# Patient Record
Sex: Male | Born: 1955
Health system: Southern US, Community
[De-identification: ages and names within clinical notes are randomized; demographics above are authoritative.]

## PROBLEM LIST (undated history)

## (undated) DIAGNOSIS — E785 Hyperlipidemia, unspecified: Secondary | ICD-10-CM

## (undated) DIAGNOSIS — T7840XA Allergy, unspecified, initial encounter: Secondary | ICD-10-CM

## (undated) HISTORY — DX: Hyperlipidemia, unspecified: E78.5

## (undated) HISTORY — DX: Allergy, unspecified, initial encounter: T78.40XA

---

## 2007-06-21 ENCOUNTER — Ambulatory Visit (HOSPITAL_COMMUNITY): Admission: RE | Admit: 2007-06-21 | Discharge: 2007-06-21 | Payer: Self-pay | Admitting: Gastroenterology

## 2010-02-14 ENCOUNTER — Emergency Department (HOSPITAL_COMMUNITY): Admission: EM | Admit: 2010-02-14 | Discharge: 2010-02-14 | Payer: Self-pay | Admitting: Emergency Medicine

## 2011-03-18 LAB — POCT CARDIAC MARKERS
CKMB, poc: 1.2 ng/mL (ref 1.0–8.0)
Myoglobin, poc: 106 ng/mL (ref 12–200)
Troponin i, poc: <0.05 ng/mL (ref 0.00–0.09)

## 2011-03-18 LAB — BASIC METABOLIC PANEL
BUN: 8 mg/dL (ref 6–23)
CO2: 25 mEq/L (ref 19–32)
Calcium: 8.7 mg/dL (ref 8.4–10.5)
GFR calc non Af Amer: >60 mL/min (ref >60)
Sodium: 139 mEq/L (ref 135–145)

## 2011-03-18 LAB — URINALYSIS, ROUTINE W REFLEX MICROSCOPIC
Glucose, UA: NEGATIVE mg/dL
Specific Gravity, Urine: 1.016 (ref 1.005–1.030)
pH: 7 (ref 5.0–8.0)

## 2011-03-18 LAB — CBC
HCT: 40.2 % (ref 39.0–52.0)
MCV: 86 fL (ref 78.0–100.0)

## 2011-03-18 LAB — DIFFERENTIAL
Basophils Absolute: 0.1 10*3/uL (ref 0.0–0.1)
Eosinophils Relative: 3 % (ref 0–5)
Monocytes Relative: 13 % — ABNORMAL HIGH (ref 3–12)
Neutro Abs: 3.2 10*3/uL (ref 1.7–7.7)

## 2011-05-11 NOTE — Op Note (Signed)
Ronald Sandoval, Ronald Sandoval            ACCOUNT NO.:  [id number]   MEDICAL RECORD NO.:  [id number]          PATIENT TYPE:  AMB   LOCATION:  ENDO                         FACILITY:  [facility]   PHYSICIAN:  Anselmo Rod, M.D.  DATE OF BIRTH:  February 05, 1956   DATE OF PROCEDURE:  06/21/2007  DATE OF DISCHARGE:                               OPERATIVE REPORT   PROCEDURE PERFORMED:  Screening colonoscopy.   ENDOSCOPIST:  Anselmo Rod, M.D.   INSTRUMENT USED:  Pentax video colonoscope.   INDICATIONS FOR PROCEDURE:  55 year old African American male undergoing  screening colonoscopy to rule out colonic polyps, masses, etc.   PREPROCEDURE PREPARATION:  Informed consent was procured from the  patient.  The patient fasted for 8 hours prior to the procedure and  prepped with a bottle of magnesium citrate and a gallon of NuLytely the  night prior to the procedure.  The risks and benefits of the procedure  including a 10% miss rate of cancer and polyps were discussed with the  patient, as well.   PREPROCEDURE PHYSICAL:  The patient had stable vital signs.  Neck  supple.  Lungs clear to auscultation.  S1 and S2 regular.  Abdomen soft  with normal bowel sounds.   DESCRIPTION OF PROCEDURE:  The patient was placed in the left lateral  decubitus position and sedated with by 50 mcg of Fentanyl and 4 mg of  Versed in slow incremental doses.  Once the patient was adequately  sedated, maintained on low flow oxygen and continuous cardiac  monitoring, the Pentax video colonoscope was advanced from the rectum to  the cecum.  The appendiceal orifice and cecum were clearly visualized  and photographed.  The terminal ileum appeared healthy without lesions.  No masses, polyps, erosions, ulcerations or diverticula seen.  Retroflexion in the rectum revealed no abnormalities.  The patient  tolerated the procedure well without complications.   IMPRESSION:  1. Normal colonoscopy to the terminal ileum.  No masses,  polyps or      diverticula noted.  2. No abnormalities noted on retroflexion.   RECOMMENDATIONS:  1. Repeat colonoscopy recommended in the next ten years. If the      patient has any abnormal symptoms in the interim, she should      contact the office immediately for further recommendations.  2. Outpatient follow-up as need arises in the future.      Anselmo Rod, M.D.  Electronically Signed     JNM/MEDQ  D:  06/21/2007  T:  06/21/2007  Job:  [id number]

## 2011-12-28 HISTORY — PX: OTHER SURGICAL HISTORY: SHX169

## 2015-10-15 ENCOUNTER — Encounter: Payer: Self-pay | Admitting: Medical

## 2015-10-15 ENCOUNTER — Ambulatory Visit (INDEPENDENT_AMBULATORY_CARE_PROVIDER_SITE_OTHER): Payer: BLUE CROSS/BLUE SHIELD | Admitting: Medical

## 2015-10-15 VITALS — BP 136/80 | HR 77 | Temp 98.1°F | Resp 16 | Ht 68.0 in | Wt 227.6 lb

## 2015-10-15 DIAGNOSIS — Z1211 Encounter for screening for malignant neoplasm of colon: Secondary | ICD-10-CM

## 2015-10-15 DIAGNOSIS — L989 Disorder of the skin and subcutaneous tissue, unspecified: Secondary | ICD-10-CM

## 2015-10-15 DIAGNOSIS — Z0000 Encounter for general adult medical examination without abnormal findings: Secondary | ICD-10-CM | POA: Insufficient documentation

## 2015-10-15 DIAGNOSIS — J309 Allergic rhinitis, unspecified: Secondary | ICD-10-CM | POA: Insufficient documentation

## 2015-10-15 DIAGNOSIS — E785 Hyperlipidemia, unspecified: Secondary | ICD-10-CM

## 2015-10-15 DIAGNOSIS — Z125 Encounter for screening for malignant neoplasm of prostate: Secondary | ICD-10-CM | POA: Diagnosis not present

## 2015-10-15 DIAGNOSIS — J3089 Other allergic rhinitis: Secondary | ICD-10-CM

## 2015-10-15 LAB — POCT URINALYSIS DIPSTICK
Bilirubin, UA: NEGATIVE
Glucose, UA: NEGATIVE
KETONES UA: NEGATIVE
LEUKOCYTES UA: NEGATIVE
NITRITE UA: NEGATIVE
PH UA: 7.5
PROTEIN UA: NEGATIVE
SPEC GRAV UA: 1.02
UROBILINOGEN UA: 0.2

## 2015-10-15 NOTE — Addendum Note (Signed)
Addended by: Neldon LabellaMABE, Aleksa Collinsworth S on: 10/15/2015 04:08 PM   Modules accepted: Orders

## 2015-10-15 NOTE — Assessment & Plan Note (Signed)
Mild in past. Prior provider Dc'd med since was not real high.

## 2015-10-15 NOTE — Progress Notes (Signed)
Subjective:    Patient ID: Ronald Sandoval, male    DOB: 1956/08/05, 59 y.o.   MRN: [id number]  HPI  I have reviewed pt PMH, PSH, FH, Social History and Surgical History  Pt works Primary school teacher for [employer], Exercise about 3 times a week some jogging and weights, sodas mountain dew, Moderate healthy diet, he could eat more fruits, single. 1 child.  Pt was going to [place] [facility].  Spring allergies mild- resolved with otc meds.  Hyperlipidemia- states about 1.5 years ago was taken off of his medication. He states his lipids were never real high per his report. Pt did not have any side effects from medications he use in the past. Used simvastatin.   Pt describes he may have hepatis A in past. Since then had liver enzymes elevated. Pt [facility] has followed up on this he states work up was negative.  Pt last colonosocpy was in 2006.   Pt just had flu vaccine 2 days ago at a work.  Pt never had shingles vaccine per our records. Pt not up to date with tdap. But he states usually gets check ups each year.  Pt reports no acute complaints today.     Review of Systems  Constitutional: Negative for fever, chills, diaphoresis, activity change and fatigue.  Respiratory: Negative for cough, chest tightness and shortness of breath.   Cardiovascular: Negative for chest pain, palpitations and leg swelling.  Gastrointestinal: Negative for nausea, vomiting and abdominal pain.  Musculoskeletal: Negative for neck pain and neck stiffness.  Neurological: Negative for dizziness, tremors, seizures, syncope, facial asymmetry, speech difficulty, weakness, light-headedness, numbness and headaches.  Psychiatric/Behavioral: Negative for behavioral problems, confusion and agitation. The patient is not nervous/anxious.      Past Medical History  Diagnosis Date  . Allergy   . Hyperlipidemia     Social History   Social History  . Marital Status: Widowed    Spouse Name: N/A  . Number of Children:  N/A  . Years of Education: N/A   Occupational History  . Not on file.   Social History Main Topics  . Smoking status: Never Smoker   . Smokeless tobacco: Never Used  . Alcohol Use: No  . Drug Use: No  . Sexual Activity: Not on file   Other Topics Concern  . Not on file   Social History Narrative  . No narrative on file    Past Surgical History  Procedure Laterality Date  . Corn removal Left 2013     History reviewed. No pertinent family history.  No Known Allergies  No current outpatient prescriptions on file prior to visit.   No current facility-administered medications on file prior to visit.    BP 136/80 mmHg  Pulse 77  Temp(Src) 98.1 F (36.7 C) (Oral)  Resp 16  Ht  (1.727 m)  Wt 227 lb 9.6 oz (103.239 kg)  BMI 34.61 kg/m2  SpO2 98%       Objective:   Physical Exam  General Mental Status- Alert. Orientation- Oriented x3.  Build and Nutrition- Well nourished and Well Developed.  Skin General:-Normal. Color- Normal color. Moisture- Normal. Temperature-Warm. Rt upper back- 3 moles modeate size about 6 mm. Lt lower back- mole large exceeds 8 mm. Diffuse scattered mole on back. Overall dry skin.  HEENT  Ears- Normal. Auditory Canal- Bilateral-Normal. Tympanic Membrane- Bilateral-Normal. Eye Fundi-Bilateral-Normal. Pupil- bilateral- Direct reaction to light normal. Nose & Sinuses- Normal. Nostrils-Bilateral- Normal. Mouth & Throat-Normal.  Neck Neck- No Bruits or  Masses. Trachea midline.  Thyroid- Normal.  Chest and Lung Exam Percussion: Quality and Intensity-Percussion normal. Percussion of the chest reveals- No Dullness.  Palpation: Palpation of the chest reveals- Non-tender- No dullness. Auscultation: Breath Sounds- Normal.  Adventitous Sounds:-No adventitious sounds.  Cardiovascular Inspection:- No Heaves. Auscultation:-Normal sinus rhythm without murmur gallop, S1 WNL and S2 WNL.  Abdomen Inspection:-Inspection Normal.  Inspection of the abdomen reveals- No hernias Palpation/Percussion:- Palpation and Percussion of the Abdomen reveal- Non Tender and No Palpable abdominal masses. Liver: Other Characteristics- No hepatomegaly. Spleen:Other Characteristics- No Splenomegaly. Auscultation:- Auscultation of the abdomen reveals- Bowel sounds normal and No Abdominal bruits.  Male Genitourinary Declined by pt.(pt made aware of improtance of both dre and psa. He still declines)  Rectal Declined by pt..  Peripheral  Vascular Lower Extremity:Inspection- Bilateral-Inspection Normal    Neurologic Mental Status:- Normal. Cranial Nerves:-Normal Bilaterally. Motor:-Normal. Strength:5/5 normal muscle strength-All Muscles.  Coordination-Normal. Gait- Normal.  Meningeal Signs- None.  Musculoskeletal Global Assessment General-Joints show full range of motion without obvious deformity and Normal muscle mass. Strength in upper and lower extremities.  Lymphatics General lymphatics Description- No generalized lymphadenopathy.      Assessment & Plan:

## 2015-10-15 NOTE — Progress Notes (Signed)
Pre visit review using our clinic review tool, if applicable. No additional management support is needed unless otherwise documented below in the visit note. 

## 2015-10-15 NOTE — Patient Instructions (Signed)
Allergic rhinitis Mild spring allergies.otc meds resolve when occurs.  Hyperlipidemia Mild in past. Prior provider Dc'd med since was not real high.  Wellness examination Cbc, cmp, tsh, lipid, psa and ua. Pt offered shingles vaccine today.    Follow up date to be determined after lab review.  Preventive Care for Adults, Male A healthy lifestyle and preventive care can promote health and wellness. Preventive health guidelines for men include the following key practices:  A routine yearly physical is a good way to check with your health care provider about your health and preventative screening. It is a chance to share any concerns and updates on your health and to receive a thorough exam.  Visit your dentist for a routine exam and preventative care every 6 months. Brush your teeth twice a day and floss once a day. Good oral hygiene prevents tooth decay and gum disease.  The frequency of eye exams is based on your age, health, family medical history, use of contact lenses, and other factors. Follow your health care provider's recommendations for frequency of eye exams.  Eat a healthy diet. Foods such as vegetables, fruits, whole grains, low-fat dairy products, and lean protein foods contain the nutrients you need without too many calories. Decrease your intake of foods high in solid fats, added sugars, and salt. Eat the right amount of calories for you.Get information about a proper diet from your health care provider, if necessary.  Regular physical exercise is one of the most important things you can do for your health. Most adults should get at least 150 minutes of moderate-intensity exercise (any activity that increases your heart rate and causes you to sweat) each week. In addition, most adults need muscle-strengthening exercises on 2 or more days a week.  Maintain a healthy weight. The body mass index (BMI) is a screening tool to identify possible weight problems. It provides an  estimate of body fat based on height and weight. Your health care provider can find your BMI and can help you achieve or maintain a healthy weight.For adults 20 years and older:  A BMI below 18.5 is considered underweight.  A BMI of 18.5 to 24.9 is normal.  A BMI of 25 to 29.9 is considered overweight.  A BMI of 30 and above is considered obese.  Maintain normal blood lipids and cholesterol levels by exercising and minimizing your intake of saturated fat. Eat a balanced diet with plenty of fruit and vegetables. Blood tests for lipids and cholesterol should begin at age 99 and be repeated every 5 years. If your lipid or cholesterol levels are high, you are over 50, or you are at high risk for heart disease, you may need your cholesterol levels checked more frequently.Ongoing high lipid and cholesterol levels should be treated with medicines if diet and exercise are not working.  If you smoke, find out from your health care provider how to quit. If you do not use tobacco, do not start.  Lung cancer screening is recommended for adults aged 66-80 years who are at high risk for developing lung cancer because of a history of smoking. A yearly low-dose CT scan of the lungs is recommended for people who have at least a 30-pack-year history of smoking and are a current smoker or have quit within the past 15 years. A pack year of smoking is smoking an average of 1 pack of cigarettes a day for 1 year (for example: 1 pack a day for 30 years or 2 packs a  day for 15 years). Yearly screening should continue until the smoker has stopped smoking for at least 15 years. Yearly screening should be stopped for people who develop a health problem that would prevent them from having lung cancer treatment.  If you choose to drink alcohol, do not have more than 2 drinks per day. One drink is considered to be 12 ounces (355 mL) of beer, 5 ounces (148 mL) of wine, or 1.5 ounces (44 mL) of liquor.  Avoid use of street  drugs. Do not share needles with anyone. Ask for help if you need support or instructions about stopping the use of drugs.  High blood pressure causes heart disease and increases the risk of stroke. Your blood pressure should be checked at least every 1-2 years. Ongoing high blood pressure should be treated with medicines, if weight loss and exercise are not effective.  If you are 41-33 years old, ask your health care provider if you should take aspirin to prevent heart disease.  Diabetes screening is done by taking a blood sample to check your blood glucose level after you have not eaten for a certain period of time (fasting). If you are not overweight and you do not have risk factors for diabetes, you should be screened once every 3 years starting at age 57. If you are overweight or obese and you are 57-41 years of age, you should be screened for diabetes every year as part of your cardiovascular risk assessment.  Colorectal cancer can be detected and often prevented. Most routine colorectal cancer screening begins at the age of 46 and continues through age 49. However, your health care provider may recommend screening at an earlier age if you have risk factors for colon cancer. On a yearly basis, your health care provider may provide home test kits to check for hidden blood in the stool. Use of a small camera at the end of a tube to directly examine the colon (sigmoidoscopy or colonoscopy) can detect the earliest forms of colorectal cancer. Talk to your health care provider about this at age 32, when routine screening begins. Direct exam of the colon should be repeated every 5-10 years through age 42, unless early forms of precancerous polyps or small growths are found.  People who are at an increased risk for hepatitis B should be screened for this virus. You are considered at high risk for hepatitis B if:  You were born in a country where hepatitis B occurs often. Talk with your health care provider  about which countries are considered high risk.  Your parents were born in a high-risk country and you have not received a shot to protect against hepatitis B (hepatitis B vaccine).  You have HIV or AIDS.  You use needles to inject street drugs.  You live with, or have sex with, someone who has hepatitis B.  You are a man who has sex with other men (MSM).  You get hemodialysis treatment.  You take certain medicines for conditions such as cancer, organ transplantation, and autoimmune conditions.  Hepatitis C blood testing is recommended for all people born from [birth year] through 1965 and any individual with known risks for hepatitis C.  Practice safe sex. Use condoms and avoid high-risk sexual practices to reduce the spread of sexually transmitted infections (STIs). STIs include gonorrhea, chlamydia, syphilis, trichomonas, herpes, HPV, and human immunodeficiency virus (HIV). Herpes, HIV, and HPV are viral illnesses that have no cure. They can result in disability, cancer, and death.  If  you are a man who has sex with other men, you should be screened at least once per year for:  HIV.  Urethral, rectal, and pharyngeal infection of gonorrhea, chlamydia, or both.  If you are at risk of being infected with HIV, it is recommended that you take a prescription medicine daily to prevent HIV infection. This is called preexposure prophylaxis (PrEP). You are considered at risk if:  You are a man who has sex with other men (MSM) and have other risk factors.  You are a heterosexual man, are sexually active, and are at increased risk for HIV infection.  You take drugs by injection.  You are sexually active with a partner who has HIV.  Talk with your health care provider about whether you are at high risk of being infected with HIV. If you choose to begin PrEP, you should first be tested for HIV. You should then be tested every 3 months for as long as you are taking PrEP.  A one-time screening for  abdominal aortic aneurysm (AAA) and surgical repair of large AAAs by ultrasound are recommended for men ages 18 to 33 years who are current or former smokers.  Healthy men should no longer receive prostate-specific antigen (PSA) blood tests as part of routine cancer screening. Talk with your health care provider about prostate cancer screening.  Testicular cancer screening is not recommended for adult males who have no symptoms. Screening includes self-exam, a health care provider exam, and other screening tests. Consult with your health care provider about any symptoms you have or any concerns you have about testicular cancer.  Use sunscreen. Apply sunscreen liberally and repeatedly throughout the day. You should seek shade when your shadow is shorter than you. Protect yourself by wearing long sleeves, pants, a wide-brimmed hat, and sunglasses year round, whenever you are outdoors.  Once a month, do a whole-body skin exam, using a mirror to look at the skin on your back. Tell your health care provider about new moles, moles that have irregular borders, moles that are larger than a pencil eraser, or moles that have changed in shape or color.  Stay current with required vaccines (immunizations).  Influenza vaccine. All adults should be immunized every year.  Tetanus, diphtheria, and acellular pertussis (Td, Tdap) vaccine. An adult who has not previously received Tdap or who does not know his vaccine status should receive 1 dose of Tdap. This initial dose should be followed by tetanus and diphtheria toxoids (Td) booster doses every 10 years. Adults with an unknown or incomplete history of completing a 3-dose immunization series with Td-containing vaccines should begin or complete a primary immunization series including a Tdap dose. Adults should receive a Td booster every 10 years.  Varicella vaccine. An adult without evidence of immunity to varicella should receive 2 doses or a second dose if he has  previously received 1 dose.  Human papillomavirus (HPV) vaccine. Males aged 11-21 years who have not received the vaccine previously should receive the 3-dose series. Males aged 22-26 years may be immunized. Immunization is recommended through the age of 70 years for any male who has sex with males and did not get any or all doses earlier. Immunization is recommended for any person with an immunocompromised condition through the age of 80 years if he did not get any or all doses earlier. During the 3-dose series, the second dose should be obtained 4-8 weeks after the first dose. The third dose should be obtained 24 weeks after the  first dose and 16 weeks after the second dose.  Zoster vaccine. One dose is recommended for adults aged 70 years or older unless certain conditions are present.  Measles, mumps, and rubella (MMR) vaccine. Adults born before [birth year] generally are considered immune to measles and mumps. Adults born in [birth year] or later should have 1 or more doses of MMR vaccine unless there is a contraindication to the vaccine or there is laboratory evidence of immunity to each of the three diseases. A routine second dose of MMR vaccine should be obtained at least 28 days after the first dose for students attending postsecondary schools, health care workers, or international travelers. People who received inactivated measles vaccine or an unknown type of measles vaccine during 1963-1967 should receive 2 doses of MMR vaccine. People who received inactivated mumps vaccine or an unknown type of mumps vaccine before 1979 and are at high risk for mumps infection should consider immunization with 2 doses of MMR vaccine. Unvaccinated health care workers born before [birth year] who lack laboratory evidence of measles, mumps, or rubella immunity or laboratory confirmation of disease should consider measles and mumps immunization with 2 doses of MMR vaccine or rubella immunization with 1 dose of MMR vaccine.  Pneumococcal  13-valent conjugate (PCV13) vaccine. When indicated, a person who is uncertain of his immunization history and has no record of immunization should receive the PCV13 vaccine. All adults 57 years of age and older should receive this vaccine. An adult aged 85 years or older who has certain medical conditions and has not been previously immunized should receive 1 dose of PCV13 vaccine. This PCV13 should be followed with a dose of pneumococcal polysaccharide (PPSV23) vaccine. Adults who are at high risk for pneumococcal disease should obtain the PPSV23 vaccine at least 8 weeks after the dose of PCV13 vaccine. Adults older than 60 years of age who have normal immune system function should obtain the PPSV23 vaccine dose at least 1 year after the dose of PCV13 vaccine.  Pneumococcal polysaccharide (PPSV23) vaccine. When PCV13 is also indicated, PCV13 should be obtained first. All adults aged 44 years and older should be immunized. An adult younger than age 79 years who has certain medical conditions should be immunized. Any person who resides in a nursing home or long-term care facility should be immunized. An adult smoker should be immunized. People with an immunocompromised condition and certain other conditions should receive both PCV13 and PPSV23 vaccines. People with human immunodeficiency virus (HIV) infection should be immunized as soon as possible after diagnosis. Immunization during chemotherapy or radiation therapy should be avoided. Routine use of PPSV23 vaccine is not recommended for American Indians, [place]atives, or people younger than 65 years unless there are medical conditions that require PPSV23 vaccine. When indicated, people who have unknown immunization and have no record of immunization should receive PPSV23 vaccine. One-time revaccination 5 years after the first dose of PPSV23 is recommended for people aged 19-64 years who have chronic kidney failure, nephrotic syndrome, asplenia, or  immunocompromised conditions. People who received 1-2 doses of PPSV23 before age 39 years should receive another dose of PPSV23 vaccine at age 58 years or later if at least 5 years have passed since the previous dose. Doses of PPSV23 are not needed for people immunized with PPSV23 at or after age 51 years.  Meningococcal vaccine. Adults with asplenia or persistent complement component deficiencies should receive 2 doses of quadrivalent meningococcal conjugate (MenACWY-D) vaccine. The doses should be obtained at least 2 months apart.  Microbiologists working with certain meningococcal bacteria, [place] recruits, people at risk during an outbreak, and people who travel to or live in countries with a high rate of meningitis should be immunized. A first-year college student up through age 53 years who is living in a residence hall should receive a dose if he did not receive a dose on or after his 16th birthday. Adults who have certain high-risk conditions should receive one or more doses of vaccine.  Hepatitis A vaccine. Adults who wish to be protected from this disease, have chronic liver disease, work with hepatitis A-infected animals, work in hepatitis A research labs, or travel to or work in countries with a high rate of hepatitis A should be immunized. Adults who were previously unvaccinated and who anticipate close contact with an international adoptee during the first 60 days after arrival in the [place] from a country with a high rate of hepatitis A should be immunized.  Hepatitis B vaccine. Adults should be immunized if they wish to be protected from this disease, are under age 68 years and have diabetes, have chronic liver disease, have had more than one sex partner in the past 6 months, may be exposed to blood or other infectious body fluids, are household contacts or sex partners of hepatitis B positive people, are clients or workers in certain care facilities, or travel to or work in countries  with a high rate of hepatitis B.  Haemophilus influenzae type b (Hib) vaccine. A previously unvaccinated person with asplenia or sickle cell disease or having a scheduled splenectomy should receive 1 dose of Hib vaccine. Regardless of previous immunization, a recipient of a hematopoietic stem cell transplant should receive a 3-dose series 6-12 months after his successful transplant. Hib vaccine is not recommended for adults with HIV infection. Preventive Service / Frequency Ages 80 to 70  Blood pressure check.** / Every 3-5 years.  Lipid and cholesterol check.** / Every 5 years beginning at age 80.  Hepatitis C blood test.** / For any individual with known risks for hepatitis C.  Skin self-exam. / Monthly.  Influenza vaccine. / Every year.  Tetanus, diphtheria, and acellular pertussis (Tdap, Td) vaccine.** / Consult your health care provider. 1 dose of Td every 10 years.  Varicella vaccine.** / Consult your health care provider.  HPV vaccine. / 3 doses over 6 months, if 23 or younger.  Measles, mumps, rubella (MMR) vaccine.** / You need at least 1 dose of MMR if you were born in 1957 or later. You may also need a second dose.  Pneumococcal 13-valent conjugate (PCV13) vaccine.** / Consult your health care provider.  Pneumococcal polysaccharide (PPSV23) vaccine.** / 1 to 2 doses if you smoke cigarettes or if you have certain conditions.  Meningococcal vaccine.** / 1 dose if you are age 11 to 54 years and a Market researcher living in a residence hall, or have one of several medical conditions. You may also need additional booster doses.  Hepatitis A vaccine.** / Consult your health care provider.  Hepatitis B vaccine.** / Consult your health care provider.  Haemophilus influenzae type b (Hib) vaccine.** / Consult your health care provider. Ages 70 to 66  Blood pressure check.** / Every year.  Lipid and cholesterol check.** / Every 5 years beginning at age 87.  Lung  cancer screening. / Every year if you are aged 49-80 years and have a 30-pack-year history of smoking and currently smoke or have quit within the past 15 years. Yearly screening is  stopped once you have quit smoking for at least 15 years or develop a health problem that would prevent you from having lung cancer treatment.  Fecal occult blood test (FOBT) of stool. / Every year beginning at age 47 and continuing until age 80. You may not have to do this test if you get a colonoscopy every 10 years.  Flexible sigmoidoscopy** or colonoscopy.** / Every 5 years for a flexible sigmoidoscopy or every 10 years for a colonoscopy beginning at age 37 and continuing until age 25.  Hepatitis C blood test.** / For all people born from [birth year] through 1965 and any individual with known risks for hepatitis C.  Skin self-exam. / Monthly.  Influenza vaccine. / Every year.  Tetanus, diphtheria, and acellular pertussis (Tdap/Td) vaccine.** / Consult your health care provider. 1 dose of Td every 10 years.  Varicella vaccine.** / Consult your health care provider.  Zoster vaccine.** / 1 dose for adults aged 25 years or older.  Measles, mumps, rubella (MMR) vaccine.** / You need at least 1 dose of MMR if you were born in 1957 or later. You may also need a second dose.  Pneumococcal 13-valent conjugate (PCV13) vaccine.** / Consult your health care provider.  Pneumococcal polysaccharide (PPSV23) vaccine.** / 1 to 2 doses if you smoke cigarettes or if you have certain conditions.  Meningococcal vaccine.** / Consult your health care provider.  Hepatitis A vaccine.** / Consult your health care provider.  Hepatitis B vaccine.** / Consult your health care provider.  Haemophilus influenzae type b (Hib) vaccine.** / Consult your health care provider. Ages 84 and over  Blood pressure check.** / Every year.  Lipid and cholesterol check.**/ Every 5 years beginning at age 51.  Lung cancer screening. / Every year if you  are aged 64-80 years and have a 30-pack-year history of smoking and currently smoke or have quit within the past 15 years. Yearly screening is stopped once you have quit smoking for at least 15 years or develop a health problem that would prevent you from having lung cancer treatment.  Fecal occult blood test (FOBT) of stool. / Every year beginning at age 83 and continuing until age 9. You may not have to do this test if you get a colonoscopy every 10 years.  Flexible sigmoidoscopy** or colonoscopy.** / Every 5 years for a flexible sigmoidoscopy or every 10 years for a colonoscopy beginning at age 36 and continuing until age 19.  Hepatitis C blood test.** / For all people born from [birth year] through 1966 and any individual with known risks for hepatitis C.  Abdominal aortic aneurysm (AAA) screening.** / A one-time screening for ages 4 to 37 years who are current or former smokers.  Skin self-exam. / Monthly.  Influenza vaccine. / Every year.  Tetanus, diphtheria, and acellular pertussis (Tdap/Td) vaccine.** / 1 dose of Td every 10 years.  Varicella vaccine.** / Consult your health care provider.  Zoster vaccine.** / 1 dose for adults aged 68 years or older.  Pneumococcal 13-valent conjugate (PCV13) vaccine.** / 1 dose for all adults aged 61 years and older.  Pneumococcal polysaccharide (PPSV23) vaccine.** / 1 dose for all adults aged 49 years and older.  Meningococcal vaccine.** / Consult your health care provider.  Hepatitis A vaccine.** / Consult your health care provider.  Hepatitis B vaccine.** / Consult your health care provider.  Haemophilus influenzae type b (Hib) vaccine.** / Consult your health care provider. **Family history and personal history of risk and conditions may change your  health care provider's recommendations.   This information is not intended to replace advice given to you by your health care provider. Make sure you discuss any questions you have with your  health care provider.   Document Released: 02/08/2002 Document Revised: 01/03/2015 Document Reviewed: 05/10/2011 Elsevier Interactive Patient Education [employer].

## 2015-10-15 NOTE — Assessment & Plan Note (Signed)
Mild spring allergies.otc meds resolve when occurs.

## 2015-10-15 NOTE — Assessment & Plan Note (Signed)
Cbc, cmp, tsh, lipid, psa and ua. Pt offered shingles vaccine today.

## 2015-10-16 ENCOUNTER — Encounter: Payer: Self-pay | Admitting: Medical

## 2015-10-16 LAB — COMPREHENSIVE METABOLIC PANEL
ALBUMIN: 3.9 g/dL (ref 3.5–5.2)
ALK PHOS: 66 U/L (ref 39–117)
ALT: 14 U/L (ref 0–53)
AST: 22 U/L (ref 0–37)
BILIRUBIN TOTAL: 0.9 mg/dL (ref 0.2–1.2)
BUN: 10 mg/dL (ref 6–23)
CO2: 29 mEq/L (ref 19–32)
Calcium: 9 mg/dL (ref 8.4–10.5)
Chloride: 101 mEq/L (ref 96–112)
Creatinine, Ser: 1.27 mg/dL (ref 0.40–1.50)
GFR: 74.38 mL/min (ref >60.00)
Glucose, Bld: 85 mg/dL (ref 70–99)
POTASSIUM: 4.1 meq/L (ref 3.5–5.1)
Sodium: 138 mEq/L (ref 135–145)
TOTAL PROTEIN: 7.3 g/dL (ref 6.0–8.3)

## 2015-10-16 LAB — CBC WITH DIFFERENTIAL/PLATELET
Basophils Absolute: 0.1 10*3/uL (ref 0.0–0.1)
Basophils Relative: 1.1 % (ref 0.0–3.0)
EOS PCT: 2.9 % (ref 0.0–5.0)
Eosinophils Absolute: 0.2 10*3/uL (ref 0.0–0.7)
HEMATOCRIT: 43.7 % (ref 39.0–52.0)
HEMOGLOBIN: 14.2 g/dL (ref 13.0–17.0)
LYMPHS PCT: 34.2 % (ref 12.0–46.0)
Lymphs Abs: 2.1 10*3/uL (ref 0.7–4.0)
MCHC: 32.5 g/dL (ref 30.0–36.0)
MCV: 86.6 fl (ref 78.0–100.0)
MONOS PCT: 8.6 % (ref 3.0–12.0)
Monocytes Absolute: 0.5 10*3/uL (ref 0.1–1.0)
Neutro Abs: 3.2 10*3/uL (ref 1.4–7.7)
Neutrophils Relative %: 53.2 % (ref 43.0–77.0)
Platelets: 249 10*3/uL (ref 150.0–400.0)
RBC: 5.05 Mil/uL (ref 4.22–5.81)
RDW: 15.1 % (ref 11.5–15.5)
WBC: 6 10*3/uL (ref 4.0–10.5)

## 2015-10-16 LAB — LIPID PANEL
Cholesterol: 204 mg/dL — ABNORMAL HIGH (ref 0–200)
HDL: 57.7 mg/dL (ref >39.00)
LDL Cholesterol: 133 mg/dL — ABNORMAL HIGH (ref 0–99)
NONHDL: 146.27
TRIGLYCERIDES: 67 mg/dL (ref 0.0–149.0)
Total CHOL/HDL Ratio: 4
VLDL: 13.4 mg/dL (ref 0.0–40.0)

## 2015-10-16 LAB — PSA: PSA: 0.21 ng/mL (ref 0.10–4.00)

## 2015-10-16 LAB — TSH: TSH: 1.12 u[IU]/mL (ref 0.35–4.50)

## 2016-01-30 ENCOUNTER — Telehealth: Payer: Self-pay | Admitting: Medical

## 2016-01-30 NOTE — Telephone Encounter (Signed)
Documented 01/30/16.

## 2016-01-30 NOTE — Telephone Encounter (Signed)
Per OV note 10/15/15 pt had flu shot 2 days before appt

## 2016-08-24 DIAGNOSIS — D2371 Other benign neoplasm of skin of right lower limb, including hip: Secondary | ICD-10-CM | POA: Diagnosis not present

## 2016-08-24 DIAGNOSIS — M25571 Pain in right ankle and joints of right foot: Secondary | ICD-10-CM | POA: Diagnosis not present

## 2016-10-21 DIAGNOSIS — Z23 Encounter for immunization: Secondary | ICD-10-CM | POA: Diagnosis not present

## 2016-11-11 ENCOUNTER — Telehealth: Payer: Self-pay | Admitting: Medical

## 2016-11-11 ENCOUNTER — Ambulatory Visit (INDEPENDENT_AMBULATORY_CARE_PROVIDER_SITE_OTHER): Payer: BLUE CROSS/BLUE SHIELD | Admitting: Medical

## 2016-11-11 VITALS — BP 163/87 | HR 89 | Temp 98.3°F | Wt 235.8 lb

## 2016-11-11 DIAGNOSIS — Z0000 Encounter for general adult medical examination without abnormal findings: Secondary | ICD-10-CM | POA: Diagnosis not present

## 2016-11-11 DIAGNOSIS — Z113 Encounter for screening for infections with a predominantly sexual mode of transmission: Secondary | ICD-10-CM

## 2016-11-11 DIAGNOSIS — Z1159 Encounter for screening for other viral diseases: Secondary | ICD-10-CM

## 2016-11-11 DIAGNOSIS — Z1211 Encounter for screening for malignant neoplasm of colon: Secondary | ICD-10-CM

## 2016-11-11 MED ORDER — HYDROCHLOROTHIAZIDE 12.5 MG PO TABS: 12.5000 mg | ORAL_TABLET | Freq: Every day | ORAL | 0 refills | Status: DC

## 2016-11-11 NOTE — Telephone Encounter (Signed)
Pt saw dermatologist in 2016. Will you find out who he saw and ask them to send office note. Pt had many small to large moles on his back. Wanted to see when dermatologist recommended pt to follow up. I want the note but would you ask them to review and tell you on phone what follow up recommendation was?

## 2016-11-11 NOTE — Progress Notes (Signed)
Subjective:    Patient ID: Ronald Sandoval, male    DOB: 25-Apr-1956, 60 y.o.   MRN: [id number]  HPI   I have reviewed pt PMH, PSH, FH, Social History and Surgical History  Pt works Primary school teacherinspection for [employer], Exercise about 3 times a week some jogging and weights, sodas mountain dew, Moderate healthy diet, he could eat more fruits, single. 1 child.  Pt willing to get colonoscopy in 2018.   Pt got flu vaccine in October.  Per epic last tdap 2013.   Pt is willing to get hep c testing.   Pt willing to get hiv screen as well.  Pt states at [facility] checked bp twice last year and bp was 130/80  Review of Systems  Constitutional: Negative for chills, fatigue and fever.  HENT: Negative for congestion, ear pain, postnasal drip and rhinorrhea.   Respiratory: Negative for cough, choking, shortness of breath and wheezing.   Cardiovascular: Negative for chest pain and palpitations.  Gastrointestinal: Negative for abdominal pain, anal bleeding, blood in stool, diarrhea, nausea and vomiting.  Musculoskeletal: Negative for back pain, myalgias, neck pain and neck stiffness.  Skin: Negative for rash.  Neurological: Negative for dizziness, seizures, weakness, numbness and headaches.  Hematological: Negative for adenopathy. Does not bruise/bleed easily.  Psychiatric/Behavioral: Negative for agitation, confusion, decreased concentration, hallucinations and suicidal ideas. The patient is not nervous/anxious.    Past Medical History:  Diagnosis Date  . Allergy   . Hyperlipidemia      Social History   Social History  . Marital status: Widowed    Spouse name: N/A  . Number of children: N/A  . Years of education: N/A   Occupational History  . Not on file.   Social History Main Topics  . Smoking status: Never Smoker  . Smokeless tobacco: Never Used  . Alcohol use No  . Drug use: No  . Sexual activity: Not on file   Other Topics Concern  . Not on file   Social History  Narrative  . No narrative on file    Past Surgical History:  Procedure Laterality Date  . corn removal Left 2013     No family history on file.  No Known Allergies  No current outpatient prescriptions on file prior to visit.   No current facility-administered medications on file prior to visit.     BP (!) 163/87 (BP Location: Left Arm, Patient Position: Sitting, Cuff Size: Normal)   Pulse 89   Temp 98.3 F (36.8 C) (Oral)   Wt 235 lb 12.8 oz (107 kg)   SpO2 100%   BMI 35.85 kg/m       Objective:   Physical Exam   General Mental Status- Alert. General Appearance- Not in acute distress.   Skin General: Color- Normal Color. Moisture- Normal Moisture.  Neck Carotid Arteries- Normal color. Moisture- Normal Moisture. No carotid bruits. No JVD.  Chest and Lung Exam Auscultation: Breath Sounds:-Normal.  Cardiovascular Auscultation:Rythm- Regular. Murmurs & Other Heart Sounds:Auscultation of the heart reveals- No Murmurs.  Abdomen Inspection:-Inspeection Normal. Palpation/Percussion:Note:No mass. Palpation and Percussion of the abdomen reveal- Non Tender, Non Distended + BS, no rebound or guarding.  Neurologic Cranial Nerve exam:- CN III-XII intact(No nystagmus), symmetric smile. Strength:- 5/5 equal and symmetric strength both upper and lower extremities.  Rectal- normal sphincter tone. No masses. Prostate smooth and normal size. hemocult card negatve.  Genital exam- normal exam. No testicle masses or lump. No hernia on inguinal exam inspection.  Derm- scattered moles over  back. Moderate to high in number.(pt states when sent to derm no concerns and no follow up recommendations)      Assessment & Plan:  For your wellness exam I put in future labs. Also will get urine dip on day of physical.  For your high bp today. Please get bp machine otc and check daily. If bp is increasing start hctz. If bp increases and cardiac or neurologic symptoms then ED  evaluation.  Bring bp readings in on Monday when you get labs.  Low salt diet and exercise.  Follow up in 2 wks or as needed

## 2016-11-11 NOTE — Patient Instructions (Addendum)
For your wellness exam I put in future labs. Also will get urine dip on day of physical.  For your high bp today. Please get bp machine otc and check daily. If bp is increasing start hctz. If bp increases and cardiac or neurologic symptoms then ED evaluation.  Bring bp readings in on Monday when you get labs.  Low salt diet and exercise.  Follow up in 2 wks or as needed  DASH Eating Plan DASH stands for "Dietary Approaches to Stop Hypertension." The DASH eating plan is a healthy eating plan that has been shown to reduce high blood pressure (hypertension). Additional health benefits may include reducing the risk of type 2 diabetes mellitus, heart disease, and stroke. The DASH eating plan may also help with weight loss. What do I need to know about the DASH eating plan? For the DASH eating plan, you will follow these general guidelines:  Choose foods with less than 150 milligrams of sodium per serving (as listed on the food label).  Use salt-free seasonings or herbs instead of table salt or sea salt.  Check with your health care provider or pharmacist before using salt substitutes.  Eat lower-sodium products. These are often labeled as "low-sodium" or "no salt added."  Eat fresh foods. Avoid eating a lot of canned foods.  Eat more vegetables, fruits, and low-fat dairy products.  Choose whole grains. Look for the word "whole" as the first word in the ingredient list.  Choose fish and skinless chicken or [place] more often than red meat. Limit fish, poultry, and meat to 6 oz (170 g) each day.  Limit sweets, desserts, sugars, and sugary drinks.  Choose heart-healthy fats.  Eat more home-cooked food and less restaurant, buffet, and fast food.  Limit fried foods.  Do not fry foods. Cook foods using methods such as baking, boiling, grilling, and broiling instead.  When eating at a restaurant, ask that your food be prepared with less salt, or no salt if possible. What foods can I  eat? Seek help from a dietitian for individual calorie needs. Grains  Whole grain or whole wheat bread. Brown rice. Whole grain or whole wheat pasta. Quinoa, bulgur, and whole grain cereals. Low-sodium cereals. Corn or whole wheat flour tortillas. Whole grain cornbread. Whole grain crackers. Low-sodium crackers. Vegetables  Fresh or frozen vegetables (raw, steamed, roasted, or grilled). Low-sodium or reduced-sodium tomato and vegetable juices. Low-sodium or reduced-sodium tomato sauce and paste. Low-sodium or reduced-sodium canned vegetables. Fruits  All fresh, canned (in natural juice), or frozen fruits. Meat and Other Protein Products  Ground beef (85% or leaner), grass-fed beef, or beef trimmed of fat. Skinless chicken or [place]. Ground chicken or [place]. Pork trimmed of fat. All fish and seafood. Eggs. Dried beans, peas, or lentils. Unsalted nuts and seeds. Unsalted canned beans. Dairy  Low-fat dairy products, such as skim or 1% milk, 2% or reduced-fat cheeses, low-fat ricotta or cottage cheese, or plain low-fat yogurt. Low-sodium or reduced-sodium cheeses. Fats and Oils  Tub margarines without trans fats. Light or reduced-fat mayonnaise and salad dressings (reduced sodium). Avocado. Safflower, olive, or canola oils. Natural peanut or almond butter. Other  Unsalted popcorn and pretzels. The items listed above may not be a complete list of recommended foods or beverages. Contact your dietitian for more options.  What foods are not recommended? Grains  White bread. White pasta. White rice. Refined cornbread. Bagels and croissants. Crackers that contain trans fat. Vegetables  Creamed or fried vegetables. Vegetables in a cheese sauce.  Regular canned vegetables. Regular canned tomato sauce and paste. Regular tomato and vegetable juices. Fruits  Canned fruit in light or heavy syrup. Fruit juice. Meat and Other Protein Products  Fatty cuts of meat. Ribs, chicken wings, bacon, sausage, bologna,  salami, chitterlings, fatback, hot dogs, bratwurst, and packaged luncheon meats. Salted nuts and seeds. Canned beans with salt. Dairy  Whole or 2% milk, cream, half-and-half, and cream cheese. Whole-fat or sweetened yogurt. Full-fat cheeses or blue cheese. Nondairy creamers and whipped toppings. Processed cheese, cheese spreads, or cheese curds. Condiments  Onion and garlic salt, seasoned salt, table salt, and sea salt. Canned and packaged gravies. Worcestershire sauce. Tartar sauce. Barbecue sauce. Teriyaki sauce. Soy sauce, including reduced sodium. Steak sauce. Fish sauce. Oyster sauce. Cocktail sauce. Horseradish. Ketchup and mustard. Meat flavorings and tenderizers. Bouillon cubes. Hot sauce. Tabasco sauce. Marinades. Taco seasonings. Relishes. Fats and Oils  Butter, stick margarine, lard, shortening, ghee, and bacon fat. Coconut, palm kernel, or palm oils. Regular salad dressings. Other  Pickles and olives. Salted popcorn and pretzels. The items listed above may not be a complete list of foods and beverages to avoid. Contact your dietitian for more information.  Where can I find more information? National Heart, Lung, and Blood Institute: travelstabloid.com This information is not intended to replace advice given to you by your health care provider. Make sure you discuss any questions you have with your health care provider. Document Released: 12/02/2011 Document Revised: 05/20/2016 Document Reviewed: 10/17/2013 Elsevier Interactive Patient Education  2017 [place] Years, Male Preventive care refers to lifestyle choices and visits with your health care provider that can promote health and wellness. What does preventive care include?  A yearly physical exam. This is also called an annual well check.  Dental exams once or twice a year.  Routine eye exams. Ask your health care provider how often you should have your eyes  checked.  Personal lifestyle choices, including:  Daily care of your teeth and gums.  Regular physical activity.  Eating a healthy diet.  Avoiding tobacco and drug use.  Limiting alcohol use.  Practicing safe sex.  Taking low-dose aspirin every day starting at age 41. What happens during an annual well check? The services and screenings done by your health care provider during your annual well check will depend on your age, overall health, lifestyle risk factors, and family history of disease. Counseling  Your health care provider may ask you questions about your:  Alcohol use.  Tobacco use.  Drug use.  Emotional well-being.  Home and relationship well-being.  Sexual activity.  Eating habits.  Work and work Statistician. Screening  You may have the following tests or measurements:  Height, weight, and BMI.  Blood pressure.  Lipid and cholesterol levels. These may be checked every 5 years, or more frequently if you are over 9 years old.  Skin check.  Lung cancer screening. You may have this screening every year starting at age 29 if you have a 30-pack-year history of smoking and currently smoke or have quit within the past 15 years.  Fecal occult blood test (FOBT) of the stool. You may have this test every year starting at age 94.  Flexible sigmoidoscopy or colonoscopy. You may have a sigmoidoscopy every 5 years or a colonoscopy every 10 years starting at age 78.  Prostate cancer screening. Recommendations will vary depending on your family history and other risks.  Hepatitis C blood test.  Hepatitis B blood test.  Sexually transmitted disease (STD) testing.  Diabetes screening. This is done by checking your blood sugar (glucose) after you have not eaten for a while (fasting). You may have this done every 1-3 years. Discuss your test results, treatment options, and if necessary, the need for more tests with your health care provider. Vaccines  Your  health care provider may recommend certain vaccines, such as:  Influenza vaccine. This is recommended every year.  Tetanus, diphtheria, and acellular pertussis (Tdap, Td) vaccine. You may need a Td booster every 10 years.  Varicella vaccine. You may need this if you have not been vaccinated.  Zoster vaccine. You may need this after age 29.  Measles, mumps, and rubella (MMR) vaccine. You may need at least one dose of MMR if you were born in 1957 or later. You may also need a second dose.  Pneumococcal 13-valent conjugate (PCV13) vaccine. You may need this if you have certain conditions and have not been vaccinated.  Pneumococcal polysaccharide (PPSV23) vaccine. You may need one or two doses if you smoke cigarettes or if you have certain conditions.  Meningococcal vaccine. You may need this if you have certain conditions.  Hepatitis A vaccine. You may need this if you have certain conditions or if you travel or work in places where you may be exposed to hepatitis A.  Hepatitis B vaccine. You may need this if you have certain conditions or if you travel or work in places where you may be exposed to hepatitis B.  Haemophilus influenzae type b (Hib) vaccine. You may need this if you have certain risk factors. Talk to your health care provider about which screenings and vaccines you need and how often you need them. This information is not intended to replace advice given to you by your health care provider. Make sure you discuss any questions you have with your health care provider. Document Released: 01/09/2016 Document Revised: 09/01/2016 Document Reviewed: 10/14/2015 Elsevier Interactive Patient Education  2017 [employer].

## 2016-11-11 NOTE — Progress Notes (Signed)
Pre visit review using our clinic review tool, if applicable. No additional management support is needed unless otherwise documented below in the visit note. 

## 2016-11-12 NOTE — Telephone Encounter (Signed)
Lm on vm to find out which Dermatologist he saw

## 2016-11-15 ENCOUNTER — Other Ambulatory Visit: Payer: BLUE CROSS/BLUE SHIELD

## 2016-11-16 ENCOUNTER — Other Ambulatory Visit: Payer: BLUE CROSS/BLUE SHIELD

## 2016-11-16 ENCOUNTER — Telehealth: Payer: Self-pay | Admitting: Medical

## 2016-11-16 ENCOUNTER — Other Ambulatory Visit (INDEPENDENT_AMBULATORY_CARE_PROVIDER_SITE_OTHER): Payer: BLUE CROSS/BLUE SHIELD

## 2016-11-16 DIAGNOSIS — Z113 Encounter for screening for infections with a predominantly sexual mode of transmission: Secondary | ICD-10-CM | POA: Diagnosis not present

## 2016-11-16 DIAGNOSIS — Z0000 Encounter for general adult medical examination without abnormal findings: Secondary | ICD-10-CM

## 2016-11-16 DIAGNOSIS — Z1159 Encounter for screening for other viral diseases: Secondary | ICD-10-CM

## 2016-11-16 DIAGNOSIS — E785 Hyperlipidemia, unspecified: Secondary | ICD-10-CM

## 2016-11-16 LAB — COMPREHENSIVE METABOLIC PANEL
ALBUMIN: 4 g/dL (ref 3.5–5.2)
ALK PHOS: 70 U/L (ref 39–117)
ALT: 15 U/L (ref 0–53)
AST: 20 U/L (ref 0–37)
BILIRUBIN TOTAL: 0.8 mg/dL (ref 0.2–1.2)
BUN: 11 mg/dL (ref 6–23)
CALCIUM: 8.9 mg/dL (ref 8.4–10.5)
CO2: 27 meq/L (ref 19–32)
CREATININE: 1.3 mg/dL (ref 0.40–1.50)
Chloride: 103 mEq/L (ref 96–112)
GFR: 72.14 mL/min (ref >60.00)
Glucose, Bld: 90 mg/dL (ref 70–99)
Potassium: 4.1 mEq/L (ref 3.5–5.1)
Sodium: 137 mEq/L (ref 135–145)
Total Protein: 7.2 g/dL (ref 6.0–8.3)

## 2016-11-16 LAB — LIPID PANEL
CHOLESTEROL: 202 mg/dL — AB (ref 0–200)
HDL: 53 mg/dL (ref >39.00)
LDL CALC: 136 mg/dL — AB (ref 0–99)
NonHDL: 148.9
TRIGLYCERIDES: 65 mg/dL (ref 0.0–149.0)
Total CHOL/HDL Ratio: 4
VLDL: 13 mg/dL (ref 0.0–40.0)

## 2016-11-16 LAB — PSA: PSA: 0.24 ng/mL (ref 0.10–4.00)

## 2016-11-16 LAB — CBC WITH DIFFERENTIAL/PLATELET
BASOS ABS: 0 10*3/uL (ref 0.0–0.1)
BASOS PCT: 0.7 % (ref 0.0–3.0)
Eosinophils Absolute: 0.1 10*3/uL (ref 0.0–0.7)
Eosinophils Relative: 2.2 % (ref 0.0–5.0)
HEMATOCRIT: 43.9 % (ref 39.0–52.0)
HEMOGLOBIN: 14.5 g/dL (ref 13.0–17.0)
LYMPHS PCT: 37.7 % (ref 12.0–46.0)
Lymphs Abs: 2.3 10*3/uL (ref 0.7–4.0)
MCHC: 33 g/dL (ref 30.0–36.0)
MCV: 85.1 fl (ref 78.0–100.0)
MONOS PCT: 9.4 % (ref 3.0–12.0)
Monocytes Absolute: 0.6 10*3/uL (ref 0.1–1.0)
NEUTROS ABS: 3.1 10*3/uL (ref 1.4–7.7)
Neutrophils Relative %: 50 % (ref 43.0–77.0)
PLATELETS: 293 10*3/uL (ref 150.0–400.0)
RBC: 5.16 Mil/uL (ref 4.22–5.81)
RDW: 14.8 % (ref 11.5–15.5)
WBC: 6.2 10*3/uL (ref 4.0–10.5)

## 2016-11-16 LAB — POC URINALSYSI DIPSTICK (AUTOMATED)
Bilirubin, UA: NEGATIVE
Glucose, UA: NEGATIVE
Ketones, UA: NEGATIVE
Leukocytes, UA: NEGATIVE
NITRITE UA: NEGATIVE
PH UA: 6
Protein, UA: NEGATIVE
RBC UA: NEGATIVE
UROBILINOGEN UA: 0.2

## 2016-11-16 LAB — TSH: TSH: 1.25 u[IU]/mL (ref 0.35–4.50)

## 2016-11-16 LAB — HIV ANTIBODY (ROUTINE TESTING W REFLEX): HIV: NONREACTIVE

## 2016-11-16 NOTE — Telephone Encounter (Signed)
I forgot to mention but have him repeat lipid panel in 3 months fasting.

## 2016-11-17 LAB — HEPATITIS C ANTIBODY: HCV AB: NEGATIVE

## 2016-11-17 NOTE — Addendum Note (Signed)
Addended by: Mervin KungFERGERSON, Adael Culbreath A on: 11/17/2016 05:02 PM   Modules accepted: Orders

## 2016-11-17 NOTE — Telephone Encounter (Signed)
Notified pt and scheduled lab appt for 02/17/17 at 7am.  Future lab order entered.

## 2016-12-25 ENCOUNTER — Other Ambulatory Visit: Payer: Self-pay | Admitting: Medical

## 2017-01-14 ENCOUNTER — Encounter: Payer: Self-pay | Admitting: Medical

## 2017-02-15 ENCOUNTER — Other Ambulatory Visit: Payer: Self-pay | Admitting: Medical

## 2017-02-17 ENCOUNTER — Other Ambulatory Visit: Payer: Self-pay | Admitting: Medical

## 2017-02-17 ENCOUNTER — Other Ambulatory Visit: Payer: BLUE CROSS/BLUE SHIELD

## 2017-02-17 MED ORDER — HYDROCHLOROTHIAZIDE 12.5 MG PO TABS: 12.5000 mg | ORAL_TABLET | Freq: Every day | ORAL | 0 refills | Status: DC

## 2017-03-10 ENCOUNTER — Other Ambulatory Visit (INDEPENDENT_AMBULATORY_CARE_PROVIDER_SITE_OTHER): Payer: BLUE CROSS/BLUE SHIELD

## 2017-03-10 DIAGNOSIS — E785 Hyperlipidemia, unspecified: Secondary | ICD-10-CM | POA: Diagnosis not present

## 2017-03-10 LAB — LIPID PANEL
CHOL/HDL RATIO: 4
Cholesterol: 186 mg/dL (ref 0–200)
HDL: 45.8 mg/dL (ref >39.00)
LDL Cholesterol: 116 mg/dL — ABNORMAL HIGH (ref 0–99)
NONHDL: 139.71
Triglycerides: 120 mg/dL (ref 0.0–149.0)
VLDL: 24 mg/dL (ref 0.0–40.0)

## 2017-03-14 ENCOUNTER — Other Ambulatory Visit: Payer: Self-pay | Admitting: Medical

## 2017-03-14 DIAGNOSIS — E785 Hyperlipidemia, unspecified: Secondary | ICD-10-CM

## 2017-03-17 ENCOUNTER — Telehealth: Payer: Self-pay | Admitting: *Deleted

## 2017-03-17 ENCOUNTER — Other Ambulatory Visit: Payer: BLUE CROSS/BLUE SHIELD

## 2017-03-17 MED ORDER — HYDROCHLOROTHIAZIDE 12.5 MG PO TABS
12.5000 mg | ORAL_TABLET | Freq: Every day | ORAL | 0 refills | Status: DC
Start: 2017-03-17 — End: 2017-03-21

## 2017-03-17 NOTE — Telephone Encounter (Signed)
Faxed refill request received from [facility] for HCTZ 12.5mg  [requesting 90-day supply] Last filled by MD on 02/17/2017, #30x0 [pt was to have OV and/or labs] Last AEX - 11/11/16 Next AEX - ROV was to be in 2 Wks. [pt canceled 11/16/16 OV, 02/17/17 OV] Please Advise on refills/SLS 03/22

## 2017-03-17 NOTE — Telephone Encounter (Signed)
Pt has not shown up for follow ups. I don't know if this medication is working. Will rx 30 day of hctz. He needs to commit to follow up. Very important. See bp on that last visit with me. He needs to come in while on his meds to know if working or if needs adjusted. Might kindly explain if he does not then might consider dismissing as follow ups are critical to preventing bad outcomes from uncrontrolled htn.

## 2017-03-18 NOTE — Telephone Encounter (Signed)
Spoke with pt and advised him of below and discussed need for compliant follow up in the office. Pt voices understanding and scheduled appt for 03/24/17 at 5:15pm. 30 day rx sent to pharmacy.

## 2017-03-21 ENCOUNTER — Telehealth: Payer: Self-pay | Admitting: Medical

## 2017-03-21 MED ORDER — HYDROCHLOROTHIAZIDE 12.5 MG PO TABS: 12.5000 mg | ORAL_TABLET | Freq: Every day | ORAL | 0 refills | Status: DC

## 2017-03-21 NOTE — Telephone Encounter (Signed)
[facility] supply of pt's hydrochlorothiazide.    [facility].

## 2017-03-21 NOTE — Telephone Encounter (Signed)
Rx request to pharmacy; Requested drug refills are authorized, however, the patient needs further evaluation and/or laboratory testing before further refills are given. Ask him to make an appointment for this/SLS 03/26  Please call patient and schedule F/U visit/SLS 03/26

## 2017-03-24 ENCOUNTER — Telehealth: Payer: Self-pay | Admitting: Medical

## 2017-03-24 ENCOUNTER — Ambulatory Visit (INDEPENDENT_AMBULATORY_CARE_PROVIDER_SITE_OTHER): Payer: BLUE CROSS/BLUE SHIELD | Admitting: Medical

## 2017-03-24 ENCOUNTER — Encounter: Payer: Self-pay | Admitting: Medical

## 2017-03-24 VITALS — BP 139/85 | HR 70 | Temp 98.2°F | Resp 18 | Wt 230.8 lb

## 2017-03-24 DIAGNOSIS — I10 Essential (primary) hypertension: Secondary | ICD-10-CM | POA: Diagnosis not present

## 2017-03-24 DIAGNOSIS — E785 Hyperlipidemia, unspecified: Secondary | ICD-10-CM

## 2017-03-24 MED ORDER — HYDROCHLOROTHIAZIDE 12.5 MG PO TABS: 12.5000 mg | ORAL_TABLET | Freq: Every day | ORAL | 0 refills | Status: DC

## 2017-03-24 MED ORDER — LISINOPRIL 10 MG PO TABS: 10.0000 mg | ORAL_TABLET | Freq: Every day | ORAL | 3 refills | Status: DC

## 2017-03-24 NOTE — Progress Notes (Signed)
   Subjective:    Patient ID: Ronald Sandoval, male    DOB: February 22, 1956, 61 y.o.   MRN: [id number]  HPI  Pt in for bp. Check. He is on the hctz. No side effects.  On review no chest pain or neurologic symptoms.  Pt states starting to exercise little bit. 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  Bp last visit was 163/87.Bp better today.  Pt states he checks his bp about every 3 days. Bp usually slightly less than 140/90.  Pt had mild high cholesterol. Pt diet moderate healty.   Review of Systems  Constitutional: Negative for chills, fatigue and fever.  Respiratory: Negative for cough, chest tightness, shortness of breath and wheezing.   Cardiovascular: Negative for chest pain and palpitations.  Gastrointestinal: Negative for abdominal pain.  Musculoskeletal: Negative for back pain, myalgias and neck stiffness.  Neurological: Negative for dizziness, syncope, weakness, numbness and headaches.  Hematological: Negative for adenopathy. Does not bruise/bleed easily.  Psychiatric/Behavioral: Negative for behavioral problems and confusion. The patient is not nervous/anxious.    Past Medical History:  Diagnosis Date  . Allergy   . Hyperlipidemia      Social History   Social History  . Marital status: Widowed    Spouse name: N/A  . Number of children: N/A  . Years of education: N/A   Occupational History  . Not on file.   Social History Main Topics  . Smoking status: Never Smoker  . Smokeless tobacco: Never Used  . Alcohol use No  . Drug use: No  . Sexual activity: Not on file   Other Topics Concern  . Not on file   Social History Narrative  . No narrative on file    Past Surgical History:  Procedure Laterality Date  . corn removal Left 2013     No family history on file.  No Known Allergies  No current outpatient prescriptions on file prior to visit.   No current facility-administered medications on file prior to visit.      BP 139/85   Pulse 70   Temp 98.2 F (36.8 C) (Oral)   Resp 18   Wt 230 lb 12.8 oz (104.7 kg)   SpO2 100%   BMI 35.09 kg/m       Objective:   Physical Exam  General Mental Status- Alert. General Appearance- Not in acute distress.   Skin General: Color- Normal Color. Moisture- Normal Moisture.  Neck Carotid Arteries- Normal color. Moisture- Normal Moisture. No carotid bruits. No JVD.  Chest and Lung Exam Auscultation: Breath Sounds:-Normal.  Cardiovascular Auscultation:Rythm- Regular. Murmurs & Other Heart Sounds:Auscultation of the heart reveals- No Murmurs.  Abdomen Inspection:-Inspeection Normal. Palpation/Percussion:Note:No mass. Palpation and Percussion of the abdomen reveal- Non Tender, Non Distended + BS, no rebound or guarding.    Neurologic Cranial Nerve exam:- CN III-XII intact(No nystagmus), symmetric smile. Strength:- 5/5 equal and symmetric strength both upper and lower extremities.      Assessment & Plan:  Your high blood pressure is  borderline today continue the hctz at 12.5 mg dose. Check your blood pressure daily over the next week. If bp majority over 140/90 then add lisinopril. Print prescription provided.  For lipids repeat level in 3 months when you have scheduled.   Follow up with me about one week after I review your future lipid panel.  Ryan Palermo, Ramon DredgeEdward, PA-C

## 2017-03-24 NOTE — Telephone Encounter (Signed)
Opened to review 

## 2017-03-24 NOTE — Progress Notes (Signed)
Pre visit review using our clinic review tool, if applicable. No additional management support is needed unless otherwise documented below in the visit note. 

## 2017-03-24 NOTE — Patient Instructions (Signed)
Your high blood pressure is  borderline today continue the hctz at 12.5 mg dose. Check your blood pressure daily over the next week. If bp majority over 140/90 then add lisinopril. Print prescription provided.  For lipids repeat level in 3 months when you have scheduled.   Follow up with me about one week after I review your future lipid panel.

## 2017-04-05 DIAGNOSIS — D2372 Other benign neoplasm of skin of left lower limb, including hip: Secondary | ICD-10-CM | POA: Diagnosis not present

## 2017-04-05 DIAGNOSIS — D2371 Other benign neoplasm of skin of right lower limb, including hip: Secondary | ICD-10-CM | POA: Diagnosis not present

## 2017-05-27 ENCOUNTER — Telehealth: Payer: Self-pay | Admitting: Medical

## 2017-05-27 NOTE — Telephone Encounter (Signed)
Relation to pt: self  Call back number:[phone]   Reason for call:  [facility] will be calling on behalf of [employer] verifying patient health information, informed patient medical release form is required, voice understanding.

## 2017-05-31 ENCOUNTER — Telehealth: Payer: Self-pay | Admitting: *Deleted

## 2017-05-31 NOTE — Telephone Encounter (Signed)
Received request for Medical records from[facility] on behalf of [employer] company "for the past 5 years to the present", forwarded to [place] for email/scan/SLS 06/05

## 2017-06-16 ENCOUNTER — Other Ambulatory Visit (INDEPENDENT_AMBULATORY_CARE_PROVIDER_SITE_OTHER): Payer: BLUE CROSS/BLUE SHIELD

## 2017-06-16 ENCOUNTER — Telehealth: Payer: Self-pay | Admitting: *Deleted

## 2017-06-16 DIAGNOSIS — I10 Essential (primary) hypertension: Secondary | ICD-10-CM | POA: Diagnosis not present

## 2017-06-16 DIAGNOSIS — E785 Hyperlipidemia, unspecified: Secondary | ICD-10-CM

## 2017-06-16 DIAGNOSIS — Z113 Encounter for screening for infections with a predominantly sexual mode of transmission: Secondary | ICD-10-CM | POA: Diagnosis not present

## 2017-06-16 LAB — COMPREHENSIVE METABOLIC PANEL
ALT: 18 U/L (ref 0–53)
AST: 25 U/L (ref 0–37)
Albumin: 3.9 g/dL (ref 3.5–5.2)
Alkaline Phosphatase: 58 U/L (ref 39–117)
BILIRUBIN TOTAL: 0.6 mg/dL (ref 0.2–1.2)
BUN: 13 mg/dL (ref 6–23)
CHLORIDE: 103 meq/L (ref 96–112)
CO2: 29 mEq/L (ref 19–32)
CREATININE: 1.33 mg/dL (ref 0.40–1.50)
Calcium: 9.2 mg/dL (ref 8.4–10.5)
GFR: 70.13 mL/min (ref >60.00)
Glucose, Bld: 90 mg/dL (ref 70–99)
Potassium: 3.8 mEq/L (ref 3.5–5.1)
SODIUM: 137 meq/L (ref 135–145)
TOTAL PROTEIN: 7.1 g/dL (ref 6.0–8.3)

## 2017-06-16 LAB — LIPID PANEL
CHOL/HDL RATIO: 4
Cholesterol: 195 mg/dL (ref 0–200)
HDL: 48.6 mg/dL (ref >39.00)
LDL Cholesterol: 132 mg/dL — ABNORMAL HIGH (ref 0–99)
NONHDL: 146.28
Triglycerides: 72 mg/dL (ref 0.0–149.0)
VLDL: 14.4 mg/dL (ref 0.0–40.0)

## 2017-06-16 LAB — HEPATITIS C ANTIBODY: HCV Ab: NEGATIVE

## 2017-06-16 LAB — HIV ANTIBODY (ROUTINE TESTING W REFLEX): HIV: NONREACTIVE

## 2017-06-16 NOTE — Telephone Encounter (Signed)
Called patient stated he will wait until his appointment on 06/23/17 to have his STD test done.   PC

## 2017-06-16 NOTE — Telephone Encounter (Signed)
Urine-- gc, chlamydia hiv hsv II Hep C rpr

## 2017-06-16 NOTE — Telephone Encounter (Signed)
Pt requesting STD routine check up. Pt states started a new relationship wanted to get checked.

## 2017-06-17 LAB — HSV 2 ANTIBODY, IGG: HSV 2 Glycoprotein G Ab, IgG: 13.2 Index — ABNORMAL HIGH (ref <0.90)

## 2017-06-17 LAB — GC/CHLAMYDIA PROBE AMP
CT Probe RNA: NOT DETECTED
GC Probe RNA: NOT DETECTED

## 2017-06-17 LAB — RPR

## 2017-06-18 ENCOUNTER — Telehealth: Payer: Self-pay | Admitting: Medical

## 2017-06-18 MED ORDER — ATORVASTATIN CALCIUM 10 MG PO TABS: 10.0000 mg | ORAL_TABLET | Freq: Every day | ORAL | 3 refills | Status: DC

## 2017-06-18 NOTE — Telephone Encounter (Signed)
rx atorvastatin sent in.

## 2017-06-20 ENCOUNTER — Other Ambulatory Visit: Payer: Self-pay | Admitting: Medical

## 2017-06-20 MED ORDER — VALACYCLOVIR HCL 1 G PO TABS: 1000.0000 mg | ORAL_TABLET | Freq: Every day | ORAL | 1 refills | Status: DC

## 2017-06-23 ENCOUNTER — Telehealth: Payer: Self-pay | Admitting: Medical

## 2017-06-23 ENCOUNTER — Other Ambulatory Visit: Payer: BLUE CROSS/BLUE SHIELD

## 2017-06-23 ENCOUNTER — Ambulatory Visit (INDEPENDENT_AMBULATORY_CARE_PROVIDER_SITE_OTHER): Payer: BLUE CROSS/BLUE SHIELD | Admitting: Medical

## 2017-06-23 VITALS — BP 140/83 | HR 79 | Temp 98.0°F | Resp 16 | Ht 68.0 in | Wt 226.2 lb

## 2017-06-23 DIAGNOSIS — I10 Essential (primary) hypertension: Secondary | ICD-10-CM | POA: Diagnosis not present

## 2017-06-23 DIAGNOSIS — Z113 Encounter for screening for infections with a predominantly sexual mode of transmission: Secondary | ICD-10-CM | POA: Diagnosis not present

## 2017-06-23 MED ORDER — LISINOPRIL-HYDROCHLOROTHIAZIDE 10-12.5 MG PO TABS: 1.0000 | ORAL_TABLET | Freq: Every day | ORAL | 1 refills | Status: DC

## 2017-06-23 NOTE — Progress Notes (Signed)
Subjective:    Patient ID: Ronald Sandoval, male    DOB: [birth year], 61 y.o.   MRN: [id number]  HPI  Pt in for bp check. Today bp level. He is on both hctz and lisinopril. No side effects.  Pt  recently  also had hsv 2 igG postive he wants to repeat to make sure. He is about to start relationship with new partner. She is getting tested as well.  Pt does not want to start valtrex until he get confirmation on repeat testing since he denies every andy herpes type outbreak symptoms.   Review of Systems  Constitutional: Negative for chills, fatigue and fever.  Respiratory: Negative for cough, chest tightness, shortness of breath and wheezing.   Cardiovascular: Negative for chest pain and palpitations.  Genitourinary: Negative for discharge, dysuria, frequency, penile pain, scrotal swelling and testicular pain.  Skin:       No hx of herpes type rash.  Neurological: Negative for dizziness, numbness and headaches.  Hematological: Negative for adenopathy. Does not bruise/bleed easily.  Psychiatric/Behavioral: Negative for behavioral problems and confusion.    Past Medical History:  Diagnosis Date  . Allergy   . Hyperlipidemia      Social History   Social History  . Marital status: Widowed    Spouse name: N/A  . Number of children: N/A  . Years of education: N/A   Occupational History  . Not on file.   Social History Main Topics  . Smoking status: Never Smoker  . Smokeless tobacco: Never Used  . Alcohol use No  . Drug use: No  . Sexual activity: Not on file   Other Topics Concern  . Not on file   Social History Narrative  . No narrative on file    Past Surgical History:  Procedure Laterality Date  . corn removal Left 2013     No family history on file.  No Known Allergies  Current Outpatient Prescriptions on File Prior to Visit  Medication Sig Dispense Refill  . atorvastatin (LIPITOR) 10 MG tablet Take 1 tablet (10 mg total) by mouth daily. 30 tablet 3  .  valACYclovir (VALTREX) 1000 MG tablet Take 1 tablet (1,000 mg total) by mouth daily. 30 tablet 1   No current facility-administered medications on file prior to visit.     BP 140/83 (BP Location: Left Arm, Patient Position: Sitting, Cuff Size: Large)   Pulse 79   Temp 98 F (36.7 C) (Oral)   Resp 16   Ht 5\' 8"  (1.727 m)   Wt 226 lb 3.2 oz (102.6 kg)   SpO2 98%   BMI 34.39 kg/m       Objective:   Physical Exam  General Mental Status- Alert. General Appearance- Not in acute distress.   Skin General: Color- Normal Color. Moisture- Normal Moisture.  Neck Carotid Arteries- Normal color. Moisture- Normal Moisture. No carotid bruits. No JVD.  Chest and Lung Exam Auscultation: Breath Sounds:-Normal.  Cardiovascular Auscultation:Rythm- Regular. Murmurs & Other Heart Sounds:Auscultation of the heart reveals- No Murmurs.   Neurologic Cranial Nerve exam:- CN III-XII intact(No nystagmus), symmetric smile. Strength:- 5/5 equal and symmetric strength both upper and lower extremities.     Assessment & Plan:  Your bp is controlled today and I want you to continue same regimen.   We will repeat hsv testing tomorrow to verify no lab error. If + again then do start valtrex. Make sure you see copy of potential new partner labs.  Follow up in 3 moths  or as needed.  Quinnetta Roepke, Ramon Dredge, PA-C

## 2017-06-23 NOTE — Patient Instructions (Signed)
Your bp is controlled today and I want you to continue same regimen.   We will repeat hsv testing tomorrow to verify no lab error. If + again then do start valtrex. Make sure you see copy of potential new partner labs.  Follow up in 3 moths or as needed.

## 2017-06-23 NOTE — Telephone Encounter (Signed)
Patient called asking about bp medication patient would like to know if this has any effect on sex drive or ability? Please call patient with answer  [phone]

## 2017-06-24 ENCOUNTER — Other Ambulatory Visit: Payer: BLUE CROSS/BLUE SHIELD

## 2017-06-27 ENCOUNTER — Other Ambulatory Visit: Payer: BLUE CROSS/BLUE SHIELD

## 2017-06-27 ENCOUNTER — Telehealth: Payer: Self-pay | Admitting: Medical

## 2017-06-27 DIAGNOSIS — Z113 Encounter for screening for infections with a predominantly sexual mode of transmission: Secondary | ICD-10-CM | POA: Diagnosis not present

## 2017-06-27 MED ORDER — LISINOPRIL 20 MG PO TABS: 20.0000 mg | ORAL_TABLET | Freq: Every day | ORAL | 0 refills | Status: DC

## 2017-06-27 NOTE — Telephone Encounter (Signed)
New rx lisinopril 20 mg q day rx'd.

## 2017-06-27 NOTE — Telephone Encounter (Signed)
Pt called and asked about if any of meds he is on can affect his sex drive. The hctz component of his bp med can. It is not the most common med to effect libido or sex drive. His blood pressure is borderline on last visit.   If severe decrease in libido I could double up on the lisinopril. Increase that to 20 mg a day. And he can stop lisinopril/hctz.   Pt will check bp and give us update 10-14 days on bp levels.

## 2017-06-29 LAB — HSV(HERPES SMPLX)ABS-I+II(IGG+IGM)-BLD
HSV 1 Glycoprotein G Ab, IgG: <0.91 index (ref 0.00–0.90)
HSV 2 GLYCOPROTEIN G AB, IGG: 11.3 {index} — AB (ref 0.00–0.90)
HSVI/II Comb IgM: <0.91 Ratio (ref 0.00–0.90)

## 2017-07-19 ENCOUNTER — Ambulatory Visit (INDEPENDENT_AMBULATORY_CARE_PROVIDER_SITE_OTHER): Payer: BLUE CROSS/BLUE SHIELD | Admitting: Medical

## 2017-07-19 ENCOUNTER — Encounter: Payer: Self-pay | Admitting: Medical

## 2017-07-19 VITALS — BP 136/78 | HR 69 | Temp 98.0°F | Resp 16 | Ht 68.0 in | Wt 232.4 lb

## 2017-07-19 DIAGNOSIS — M5441 Lumbago with sciatica, right side: Secondary | ICD-10-CM | POA: Diagnosis not present

## 2017-07-19 MED ORDER — TRAMADOL HCL 50 MG PO TABS: 50.0000 mg | ORAL_TABLET | Freq: Three times a day (TID) | ORAL | 0 refills | Status: DC | PRN

## 2017-07-19 MED ORDER — PREDNISONE 10 MG PO TABS: ORAL_TABLET | ORAL | 0 refills | Status: DC

## 2017-07-19 MED FILL — traMADol HCL 50 MG TABS: 50 | 2 days supply | Qty: 6 | Fill #0

## 2017-07-19 MED FILL — predniSONE 10 MG TABS: 10 | 5 days supply | Qty: 15 | Fill #0

## 2017-07-19 NOTE — Patient Instructions (Signed)
For recent sciatica type back pain will get xray of lumbar spine today. Stop alleve otc and start 5 days taper prednisone. Tramadol for more severe pain.   Back exercises as tolerated.  Follow up in 7-10 days or as needed   Back Exercises The following exercises strengthen the muscles that help to support the back. They also help to keep the lower back flexible. Doing these exercises can help to prevent back pain or lessen existing pain. If you have back pain or discomfort, try doing these exercises 2-3 times each day or as told by your health care provider. When the pain goes away, do them once each day, but increase the number of times that you repeat the steps for each exercise (do more repetitions). If you do not have back pain or discomfort, do these exercises once each day or as told by your health care provider. Exercises Single Knee to Chest  Repeat these steps 3-5 times for each leg: 1. Lie on your back on a firm bed or the floor with your legs extended. 2. Bring one knee to your chest. Your other leg should stay extended and in contact with the floor. 3. Hold your knee in place by grabbing your knee or thigh. 4. Pull on your knee until you feel a gentle stretch in your lower back. 5. Hold the stretch for 10-30 seconds. 6. Slowly release and straighten your leg.  Pelvic Tilt  Repeat these steps 5-10 times: 1. Lie on your back on a firm bed or the floor with your legs extended. 2. Bend your knees so they are pointing toward the ceiling and your feet are flat on the floor. 3. Tighten your lower abdominal muscles to press your lower back against the floor. This motion will tilt your pelvis so your tailbone points up toward the ceiling instead of pointing to your feet or the floor. 4. With gentle tension and even breathing, hold this position for 5-10 seconds.  Cat-Cow  Repeat these steps until your lower back becomes more flexible: 1. Get into a hands-and-knees position on a  firm surface. Keep your hands under your shoulders, and keep your knees under your hips. You may place padding under your knees for comfort. 2. Let your head hang down, and point your tailbone toward the floor so your lower back becomes rounded like the back of a cat. 3. Hold this position for 5 seconds. 4. Slowly lift your head and point your tailbone up toward the ceiling so your back forms a sagging arch like the back of a cow. 5. Hold this position for 5 seconds.  Press-Ups  Repeat these steps 5-10 times: 1. Lie on your abdomen (face-down) on the floor. 2. Place your palms near your head, about shoulder-width apart. 3. While you keep your back as relaxed as possible and keep your hips on the floor, slowly straighten your arms to raise the top half of your body and lift your shoulders. Do not use your back muscles to raise your upper torso. You may adjust the placement of your hands to make yourself more comfortable. 4. Hold this position for 5 seconds while you keep your back relaxed. 5. Slowly return to lying flat on the floor.  Bridges  Repeat these steps 10 times: 1. Lie on your back on a firm surface. 2. Bend your knees so they are pointing toward the ceiling and your feet are flat on the floor. 3. Tighten your buttocks muscles and lift your buttocks off  of the floor until your waist is at almost the same height as your knees. You should feel the muscles working in your buttocks and the back of your thighs. If you do not feel these muscles, slide your feet 1-2 inches farther away from your buttocks. 4. Hold this position for 3-5 seconds. 5. Slowly lower your hips to the starting position, and allow your buttocks muscles to relax completely.  If this exercise is too easy, try doing it with your arms crossed over your chest. Abdominal Crunches  Repeat these steps 5-10 times: 1. Lie on your back on a firm bed or the floor with your legs extended. 2. Bend your knees so they are  pointing toward the ceiling and your feet are flat on the floor. 3. Cross your arms over your chest. 4. Tip your chin slightly toward your chest without bending your neck. 5. Tighten your abdominal muscles and slowly raise your trunk (torso) high enough to lift your shoulder blades a tiny bit off of the floor. Avoid raising your torso higher than that, because it can put too much stress on your low back and it does not help to strengthen your abdominal muscles. 6. Slowly return to your starting position.  Back Lifts Repeat these steps 5-10 times: 1. Lie on your abdomen (face-down) with your arms at your sides, and rest your forehead on the floor. 2. Tighten the muscles in your legs and your buttocks. 3. Slowly lift your chest off of the floor while you keep your hips pressed to the floor. Keep the back of your head in line with the curve in your back. Your eyes should be looking at the floor. 4. Hold this position for 3-5 seconds. 5. Slowly return to your starting position.  Contact a health care provider if:  Your back pain or discomfort gets much worse when you do an exercise.  Your back pain or discomfort does not lessen within 2 hours after you exercise. If you have any of these problems, stop doing these exercises right away. Do not do them again unless your health care provider says that you can. Get help right away if:  You develop sudden, severe back pain. If this happens, stop doing the exercises right away. Do not do them again unless your health care provider says that you can. This information is not intended to replace advice given to you by your health care provider. Make sure you discuss any questions you have with your health care provider. Document Released: 01/20/2005 Document Revised: 04/21/2016 Document Reviewed: 02/06/2015 Elsevier Interactive Patient Education  2017 [employer].

## 2017-07-19 NOTE — Progress Notes (Signed)
Subjective:    Patient ID: Ronald Sandoval, male    DOB: Apr 23, 1956, 61 y.o.   MRN: [id number]  HPI Pt in states last week had some left SI are pain. Then as pain started to resolve on left side note rt si area pain on Monday. Pain rt si area to rt hamstring area. Pt has been taking alleve otc but not adequate. Last took alleve today. In past if get sciatica lat 3-4 days.  On and mild low back or sciatica pain limited.   Pain level since Monday has been high level.   Review of Systems  Constitutional: Negative for chills, fatigue and fever.  Respiratory: Negative for cough, chest tightness, shortness of breath and wheezing.   Cardiovascular: Negative for chest pain and palpitations.  Gastrointestinal: Negative for abdominal pain.  Genitourinary: Negative for dysuria, flank pain and frequency.  Musculoskeletal: Positive for back pain. Negative for myalgias, neck pain and neck stiffness.       Si pain. With some pain radiating to hamstring area.  Skin: Negative for rash.  Neurological: Negative for dizziness, speech difficulty, numbness and headaches.  Hematological: Negative for adenopathy. Does not bruise/bleed easily.  Psychiatric/Behavioral: Negative for behavioral problems and confusion.   Past Medical History:  Diagnosis Date  . Allergy   . Hyperlipidemia      Social History   Social History  . Marital status: Widowed    Spouse name: N/A  . Number of children: N/A  . Years of education: N/A   Occupational History  . Not on file.   Social History Main Topics  . Smoking status: Never Smoker  . Smokeless tobacco: Never Used  . Alcohol use No  . Drug use: No  . Sexual activity: Not on file   Other Topics Concern  . Not on file   Social History Narrative  . No narrative on file    Past Surgical History:  Procedure Laterality Date  . corn removal Left 2013     No family history on file.  No Known Allergies  Current Outpatient Prescriptions on File  Prior to Visit  Medication Sig Dispense Refill  . atorvastatin (LIPITOR) 10 MG tablet Take 1 tablet (10 mg total) by mouth daily. 30 tablet 3  . lisinopril (PRINIVIL,ZESTRIL) 20 MG tablet Take 1 tablet (20 mg total) by mouth daily. 30 tablet 0  . valACYclovir (VALTREX) 1000 MG tablet Take 1 tablet (1,000 mg total) by mouth daily. 30 tablet 1   No current facility-administered medications on file prior to visit.     BP 136/78   Pulse 69   Temp 98 F (36.7 C) (Oral)   Resp 16   Ht 5\' 8"  (1.727 m)   Wt 232 lb 6.4 oz (105.4 kg)   SpO2 100%   BMI 35.34 kg/m       Objective:   Physical Exam  General Appearance- Not in acute distress.    Chest and Lung Exam Auscultation: Breath sounds:-Normal. Clear even and unlabored. Adventitious sounds:- No Adventitious sounds.  Cardiovascular Auscultation:Rythm - Regular, rate and rythm. Heart Sounds -Normal heart sounds.  Abdomen Inspection:-Inspection Normal.  Palpation/Perucssion: Palpation and Percussion of the abdomen reveal- Non Tender, No Rebound tenderness, No rigidity(Guarding) and No Palpable abdominal masses.  Liver:-Normal.  Spleen:- Normal.   Back Mid lumbar spine tenderness to palpation. But more pain direct rt si area pain on palpation. Pain on straight leg lift(rt side) Pain on lateral movements and flexion/extension of the spine.  Lower ext neurologic  L5-S1 sensation intact bilaterally. Normal patellar reflexes bilaterally. No foot drop bilaterally.      Assessment & Plan:  For recent sciatica type back pain will get xray of lumbar spine today. Stop alleve otc and start 5 days taper prednisone. Tramadol for more severe pain.   Back exercises as tolerated.  Follow up in 7-10 days or as needed  Hobart Marte, Ramon DredgeEdward, VF CorporationPA-C

## 2017-08-03 ENCOUNTER — Other Ambulatory Visit: Payer: Self-pay | Admitting: Medical

## 2017-08-03 ENCOUNTER — Other Ambulatory Visit: Payer: Self-pay | Admitting: *Deleted

## 2017-08-03 MED ORDER — LISINOPRIL 20 MG PO TABS: 20.0000 mg | ORAL_TABLET | Freq: Every day | ORAL | 0 refills | Status: DC

## 2017-08-03 NOTE — Progress Notes (Signed)
[facility] [place]; [facility] [place]. [facility], they [facility] order [facility] location. Received faxed request to have [90-day supply]; [facility] 08/08

## 2017-08-08 ENCOUNTER — Ambulatory Visit (INDEPENDENT_AMBULATORY_CARE_PROVIDER_SITE_OTHER): Payer: BLUE CROSS/BLUE SHIELD | Admitting: Medical

## 2017-08-08 ENCOUNTER — Encounter: Payer: Self-pay | Admitting: Medical

## 2017-08-08 VITALS — BP 128/74 | HR 69 | Temp 98.1°F | Resp 16 | Ht 68.0 in | Wt 229.0 lb

## 2017-08-08 DIAGNOSIS — T783XXA Angioneurotic edema, initial encounter: Secondary | ICD-10-CM | POA: Diagnosis not present

## 2017-08-08 DIAGNOSIS — T7840XA Allergy, unspecified, initial encounter: Secondary | ICD-10-CM

## 2017-08-08 MED ORDER — HYDROXYZINE HCL 25 MG PO TABS: 25.0000 mg | ORAL_TABLET | Freq: Three times a day (TID) | ORAL | 0 refills | Status: DC | PRN

## 2017-08-08 MED ORDER — AMLODIPINE BESYLATE 10 MG PO TABS: 10.0000 mg | ORAL_TABLET | Freq: Every day | ORAL | 0 refills | Status: DC

## 2017-08-08 MED ORDER — METHYLPREDNISOLONE ACETATE 40 MG/ML IJ SUSP
40.0000 mg | Freq: Once | INTRAMUSCULAR | Status: AC
  Administered 2017-08-08: 40 mg via INTRAMUSCULAR

## 2017-08-08 MED ORDER — PREDNISONE 10 MG PO TABS: ORAL_TABLET | ORAL | 0 refills | Status: DC

## 2017-08-08 MED FILL — AMLODIPINE BESYLATE 10 MG T: 10 | 30 days supply | Qty: 30 | Fill #0

## 2017-08-08 MED FILL — predniSONE 10 MG TABS: 10 | 6 days supply | Qty: 21 | Fill #0

## 2017-08-08 MED FILL — hydrOXYzine HCL 25 MG TABS: 25 | 10 days supply | Qty: 30 | Fill #0

## 2017-08-08 NOTE — Patient Instructions (Addendum)
You have swelling of lip/reaction secondary to lisinopril suspected. Please stop/discard. Will place on your allergy list.  I am prescribing amlodipine in place of today.  Depomedrol 40 mg im today.  Start prednisone taper dose tonight.  Also will rx hydroxyzine.  If event you have worse swelling, sob, wheezing, rash then advise ED evaluation.  Follow up this Wednesday or as needed  I do have plans to call you tonight. If you have any concern for worsening rash then can call me [phone]

## 2017-08-08 NOTE — Progress Notes (Signed)
   Subjective:    Patient ID: Ronald Sandoval, male    DOB: [birth year], 61 y.o.   MRN: [id number]  HPI  Pt in for swelling of  his upper lip since this am.  On review no suspicious exposure except has been on lisinopril for about month and half. Swelling of his his upper lip just started this morning. Rt upper lip and gradually got larger.  No other new meds9except ace inhibitorO, no association with food or other exposure on review.No insect bite or sting.   No shortness of breath, no wheezing, no itching to skin and no problems swallowing.  O2 sat is 100%.       Review of Systems  Constitutional: Negative for chills, fatigue and fever.  Respiratory: Negative for cough, chest tightness, shortness of breath and stridor.   Cardiovascular: Negative for chest pain and palpitations.  Gastrointestinal: Negative for abdominal pain.  Musculoskeletal: Negative for back pain and gait problem.  Skin: Negative for rash.       Upper lip swelling.  Neurological: Negative for dizziness, weakness, numbness and headaches.  Hematological: Negative for adenopathy. Does not bruise/bleed easily.  Psychiatric/Behavioral: Negative for behavioral problems and confusion.       Objective:   Physical Exam   General  Mental Status - Alert. General Appearance - Well groomed. Not in acute distress.  Skin Rashes- No Rashes.  Mouth- upper lips moderate to severe swelling. Inside of mouth. No swelling. Normal palate. Pt face is not swollen  Neck Neck- Supple. No Masses. No tracheal deviation.   Chest and Lung Exam Auscultation: Breath Sounds:-Clear even and unlabored.  Cardiovascular Auscultation:Rythm- Regular, rate and rhythm. Murmurs & Other Heart Sounds:Ausculatation of the heart reveal- No Murmurs.  Lymphatic Head & Neck General Head & Neck Lymphatics: Bilateral: Description- No Localized lymphadenopathy.      Assessment & Plan:  You have swelling of lip/reaction secondary  to lisinopril suspected. Please stop/discard. Will place on your allergy list.  I am prescribing amlodipine in place of today.  Depmedrol 40 mg im today  Start prednisone taper dose tonight.  Also will rx hydroxyzine.  If event you have worse swelling, sob, wheezing, rash then advise ED evaluation.  Follow up this Wednesday or as needed  I do have plans to call you tonight. If you have any concern for worsening rash then can call me [phone]  I did call pt around 8 pm and he reports same/stable. No worse lip swelling. No face swelling. No sob, no wheezing, no itching or fatigue. He did confirm taking meds as I advised.    Merlon Alcorta, Ramon DredgeEdward, PA-C

## 2017-08-15 ENCOUNTER — Other Ambulatory Visit: Payer: Self-pay

## 2017-08-15 MED ORDER — ATORVASTATIN CALCIUM 10 MG PO TABS: 10.0000 mg | ORAL_TABLET | Freq: Every day | ORAL | 1 refills | Status: DC

## 2017-08-15 MED ORDER — VALACYCLOVIR HCL 1 G PO TABS: 1000.0000 mg | ORAL_TABLET | Freq: Every day | ORAL | 1 refills | Status: DC

## 2017-08-16 DIAGNOSIS — D2371 Other benign neoplasm of skin of right lower limb, including hip: Secondary | ICD-10-CM | POA: Diagnosis not present

## 2017-08-25 ENCOUNTER — Other Ambulatory Visit: Payer: Self-pay | Admitting: Family Medicine

## 2017-08-25 NOTE — Telephone Encounter (Signed)
[facility] on 8.20.18/denied/thx dmf

## 2017-09-27 ENCOUNTER — Other Ambulatory Visit: Payer: Self-pay | Admitting: Medical

## 2017-10-04 ENCOUNTER — Telehealth: Payer: Self-pay | Admitting: Medical

## 2017-10-04 NOTE — Telephone Encounter (Signed)
Azad Calame Self [phone]  [facility]  amLODipine (NORVASC) 10 MG tablet   Stanislaw called to say he needs a refill for his blood pressure medicine.

## 2017-10-06 MED ORDER — AMLODIPINE BESYLATE 10 MG PO TABS: 10.0000 mg | ORAL_TABLET | Freq: Every day | ORAL | 2 refills | Status: DC

## 2017-10-07 NOTE — Telephone Encounter (Signed)
Sent rx to pharmacy

## 2017-11-11 ENCOUNTER — Telehealth: Payer: Self-pay | Admitting: Medical

## 2017-11-11 NOTE — Telephone Encounter (Signed)
Copied from CRM #[id number]. >> Nov 11, 2017  4:29 PM Elliot GaultBell, Tiffany M wrote: Relation to DG:UYQIpt:self Call back number:[phone] [id number] [place] - [address] [phone] (Phone) [phone] (Fax)    Reason for call:  Patient requesting amLODipine (NORVASC) 10 MG tablet

## 2017-11-14 NOTE — Telephone Encounter (Signed)
Pt has one refill left

## 2017-11-21 ENCOUNTER — Encounter: Payer: Self-pay | Admitting: Medical

## 2017-11-23 MED ORDER — AMLODIPINE BESYLATE 10 MG PO TABS: 10.0000 mg | ORAL_TABLET | Freq: Every day | ORAL | 5 refills | Status: DC

## 2018-02-03 DIAGNOSIS — D2371 Other benign neoplasm of skin of right lower limb, including hip: Secondary | ICD-10-CM | POA: Diagnosis not present

## 2018-02-18 ENCOUNTER — Other Ambulatory Visit: Payer: Self-pay | Admitting: Medical

## 2018-02-27 ENCOUNTER — Other Ambulatory Visit: Payer: Self-pay

## 2018-02-27 MED ORDER — ATORVASTATIN CALCIUM 10 MG PO TABS: 10.0000 mg | ORAL_TABLET | Freq: Every day | ORAL | 1 refills | Status: DC

## 2018-02-27 NOTE — Progress Notes (Unsigned)
Patient has a appointment 03/17/2018@ 8am.

## 2018-02-27 NOTE — Progress Notes (Unsigned)
Pt due for follow up please call and schedule appointment.  

## 2018-03-03 DIAGNOSIS — M71572 Other bursitis, not elsewhere classified, left ankle and foot: Secondary | ICD-10-CM | POA: Diagnosis not present

## 2018-03-03 DIAGNOSIS — M2042 Other hammer toe(s) (acquired), left foot: Secondary | ICD-10-CM | POA: Diagnosis not present

## 2018-03-17 ENCOUNTER — Ambulatory Visit (INDEPENDENT_AMBULATORY_CARE_PROVIDER_SITE_OTHER): Payer: BLUE CROSS/BLUE SHIELD | Admitting: Medical

## 2018-03-17 ENCOUNTER — Ambulatory Visit: Payer: BLUE CROSS/BLUE SHIELD | Admitting: Medical

## 2018-03-17 ENCOUNTER — Encounter: Payer: Self-pay | Admitting: Medical

## 2018-03-17 VITALS — BP 145/81 | HR 74 | Resp 16 | Ht 68.0 in | Wt 228.0 lb

## 2018-03-17 DIAGNOSIS — Z0000 Encounter for general adult medical examination without abnormal findings: Secondary | ICD-10-CM

## 2018-03-17 DIAGNOSIS — Z125 Encounter for screening for malignant neoplasm of prostate: Secondary | ICD-10-CM

## 2018-03-17 LAB — CBC WITH DIFFERENTIAL/PLATELET
BASOS PCT: 1.2 % (ref 0.0–3.0)
Basophils Absolute: 0.1 10*3/uL (ref 0.0–0.1)
EOS ABS: 0.1 10*3/uL (ref 0.0–0.7)
Eosinophils Relative: 2.1 % (ref 0.0–5.0)
HCT: 44.2 % (ref 39.0–52.0)
Hemoglobin: 14.8 g/dL (ref 13.0–17.0)
LYMPHS ABS: 1.6 10*3/uL (ref 0.7–4.0)
Lymphocytes Relative: 33.4 % (ref 12.0–46.0)
MCHC: 33.4 g/dL (ref 30.0–36.0)
MCV: 86.5 fl (ref 78.0–100.0)
MONO ABS: 0.5 10*3/uL (ref 0.1–1.0)
Monocytes Relative: 11 % (ref 3.0–12.0)
NEUTROS ABS: 2.4 10*3/uL (ref 1.4–7.7)
Neutrophils Relative %: 52.3 % (ref 43.0–77.0)
PLATELETS: 280 10*3/uL (ref 150.0–400.0)
RBC: 5.11 Mil/uL (ref 4.22–5.81)
RDW: 15.2 % (ref 11.5–15.5)
WBC: 4.7 10*3/uL (ref 4.0–10.5)

## 2018-03-17 LAB — URINALYSIS, ROUTINE W REFLEX MICROSCOPIC
Bilirubin Urine: NEGATIVE
Hgb urine dipstick: NEGATIVE
Leukocytes, UA: NEGATIVE
Nitrite: NEGATIVE
Specific Gravity, Urine: 1.025 (ref 1.000–1.030)
TOTAL PROTEIN, URINE-UPE24: NEGATIVE
Urine Glucose: NEGATIVE
Urobilinogen, UA: 0.2 (ref 0.0–1.0)
pH: 6 (ref 5.0–8.0)

## 2018-03-17 LAB — LIPID PANEL
CHOLESTEROL: 157 mg/dL (ref 0–200)
HDL: 53.3 mg/dL (ref >39.00)
LDL CALC: 93 mg/dL (ref 0–99)
NonHDL: 103.45
Total CHOL/HDL Ratio: 3
Triglycerides: 53 mg/dL (ref 0.0–149.0)
VLDL: 10.6 mg/dL (ref 0.0–40.0)

## 2018-03-17 LAB — COMPREHENSIVE METABOLIC PANEL
ALT: 14 U/L (ref 0–53)
AST: 18 U/L (ref 0–37)
Albumin: 4 g/dL (ref 3.5–5.2)
Alkaline Phosphatase: 77 U/L (ref 39–117)
BUN: 13 mg/dL (ref 6–23)
CALCIUM: 9.3 mg/dL (ref 8.4–10.5)
CHLORIDE: 106 meq/L (ref 96–112)
CO2: 27 meq/L (ref 19–32)
CREATININE: 1.15 mg/dL (ref 0.40–1.50)
GFR: 82.74 mL/min (ref >60.00)
Glucose, Bld: 82 mg/dL (ref 70–99)
Potassium: 4.7 mEq/L (ref 3.5–5.1)
SODIUM: 139 meq/L (ref 135–145)
Total Bilirubin: 1 mg/dL (ref 0.2–1.2)
Total Protein: 7.4 g/dL (ref 6.0–8.3)

## 2018-03-17 LAB — PSA: PSA: 0.33 ng/mL (ref 0.10–4.00)

## 2018-03-17 NOTE — Progress Notes (Signed)
Subjective:    Patient ID: Ronald Sandoval, male    DOB: Aug 19, 1956, 62 y.o.   MRN: [id number]  HPI   Pt in states he feels fine today.  Her for CPE. Pt is fasting today.  He expresses desire to come of meds in future if possible.  Pt is only on 2 medications lipitor and amlodipine.  Pt is overdue on colonoscopy.(pt states saw commercial about specialial imaging studies that [facility] imaging doing less invasive test than traditional colonsoscopy?) No family history of colon cancer.  Pt states bp systolic at home ranges 130-140/80's.  Review of Systems  Constitutional: Negative for chills, fatigue and fever.  HENT: Negative for congestion, ear discharge, sinus pressure, sinus pain, sore throat and tinnitus.   Respiratory: Negative for chest tightness, shortness of breath and wheezing.   Cardiovascular: Negative for chest pain and palpitations.  Gastrointestinal: Negative for abdominal distention, abdominal pain, diarrhea, nausea and rectal pain.  Genitourinary: Negative for difficulty urinating, enuresis, flank pain, penile pain and urgency.  Musculoskeletal: Negative for back pain, joint swelling and neck pain.  Skin: Negative for rash.  Neurological: Negative for speech difficulty, weakness and headaches.  Hematological: Negative for adenopathy. Does not bruise/bleed easily.  Psychiatric/Behavioral: Negative for behavioral problems, confusion, dysphoric mood and suicidal ideas. The patient is not nervous/anxious and is not hyperactive.    Past Medical History:  Diagnosis Date  . Allergy   . Hyperlipidemia      Social History   Socioeconomic History  . Marital status: Widowed    Spouse name: Not on file  . Number of children: Not on file  . Years of education: Not on file  . Highest education level: Not on file  Occupational History  . Not on file  Social Needs  . Financial resource strain: Not on file  . Food insecurity:    Worry: Not on file    Inability:  Not on file  . Transportation needs:    Medical: Not on file    Non-medical: Not on file  Tobacco Use  . Smoking status: Never Smoker  . Smokeless tobacco: Never Used  Substance and Sexual Activity  . Alcohol use: No    Alcohol/week: 0.0 oz  . Drug use: No  . Sexual activity: Not on file  Lifestyle  . Physical activity:    Days per week: Not on file    Minutes per session: Not on file  . Stress: Not on file  Relationships  . Social connections:    Talks on phone: Not on file    Gets together: Not on file    Attends religious service: Not on file    Active member of club or organization: Not on file    Attends meetings of clubs or organizations: Not on file    Relationship status: Not on file  . Intimate partner violence:    Fear of current or ex partner: Not on file    Emotionally abused: Not on file    Physically abused: Not on file    Forced sexual activity: Not on file  Other Topics Concern  . Not on file  Social History Narrative  . Not on file    Past Surgical History:  Procedure Laterality Date  . corn removal Left 2013     No family history on file.  Allergies  Allergen Reactions  . Lisinopril Swelling    Current Outpatient Medications on File Prior to Visit  Medication Sig Dispense Refill  . amLODipine (NORVASC)  10 MG tablet Take 1 tablet (10 mg total) by mouth daily. 30 tablet 5  . atorvastatin (LIPITOR) 10 MG tablet Take 1 tablet (10 mg total) by mouth daily. 90 tablet 1   No current facility-administered medications on file prior to visit.     BP (!) 145/81 (BP Location: Left Arm, Patient Position: Sitting, Cuff Size: Large)   Pulse 74   Resp 16   Ht 5\' 8"  (1.727 m)   Wt 228 lb (103.4 kg)   SpO2 100%   BMI 34.67 kg/m       Objective:   Physical Exam  General Mental Status- Alert. General Appearance- Not in acute distress.   Skin General: Color- Normal Color. Moisture- Normal Moisture. Pt doe have dark hyperpigmented scars on  back(hx of severe acne). No worrisome mole or lesions.  Neck Carotid Arteries- Normal color. Moisture- Normal Moisture. No carotid bruits. No JVD.  Chest and Lung Exam Auscultation: Breath Sounds:-Normal.  Cardiovascular Auscultation:Rythm- Regular. Murmurs & Other Heart Sounds:Auscultation of the heart reveals- No Murmurs.  Abdomen Inspection:-Inspeection Normal. Palpation/Percussion:Note:No mass. Palpation and Percussion of the abdomen reveal- Non Tender, Non Distended + BS, no rebound or guarding.   Neurologic Cranial Nerve exam:- CN III-XII intact(No nystagmus), symmetric smile. Strength:- 5/5 equal and symmetric strength both upper and lower extremities.  Genital and rectal exam- pt deferred.      Assessment & Plan:  For you wellness exam today I have ordered cbc, cmp, psa, lipid panel, and ua.  Vaccine appear up to date  Recommend exercise and healthy diet.  We will let you know lab results as they come in.  Follow up date appointment will be determined after lab review.   Colonoscopy order place in 2016 and 2017.  Esperanza RichtersEdward Ellanie Oppedisano, PA-C

## 2018-03-17 NOTE — Patient Instructions (Signed)
For you wellness exam today I have ordered cbc, cmp, psa, lipid panel, and ua.  Vaccine appear up to date  Recommend exercise and healthy diet.  We will let you know lab results as they come in.  Follow up date appointment will be determined after lab review.    Preventive Care 40-64 Years, Male Preventive care refers to lifestyle choices and visits with your health care provider that can promote health and wellness. What does preventive care include?  A yearly physical exam. This is also called an annual well check.  Dental exams once or twice a year.  Routine eye exams. Ask your health care provider how often you should have your eyes checked.  Personal lifestyle choices, including: ? Daily care of your teeth and gums. ? Regular physical activity. ? Eating a healthy diet. ? Avoiding tobacco and drug use. ? Limiting alcohol use. ? Practicing safe sex. ? Taking low-dose aspirin every day starting at age 70. What happens during an annual well check? The services and screenings done by your health care provider during your annual well check will depend on your age, overall health, lifestyle risk factors, and family history of disease. Counseling Your health care provider may ask you questions about your:  Alcohol use.  Tobacco use.  Drug use.  Emotional well-being.  Home and relationship well-being.  Sexual activity.  Eating habits.  Work and work Statistician.  Screening You may have the following tests or measurements:  Height, weight, and BMI.  Blood pressure.  Lipid and cholesterol levels. These may be checked every 5 years, or more frequently if you are over 71 years old.  Skin check.  Lung cancer screening. You may have this screening every year starting at age 42 if you have a 30-pack-year history of smoking and currently smoke or have quit within the past 15 years.  Fecal occult blood test (FOBT) of the stool. You may have this test every year  starting at age 63.  Flexible sigmoidoscopy or colonoscopy. You may have a sigmoidoscopy every 5 years or a colonoscopy every 10 years starting at age 53.  Prostate cancer screening. Recommendations will vary depending on your family history and other risks.  Hepatitis C blood test.  Hepatitis B blood test.  Sexually transmitted disease (STD) testing.  Diabetes screening. This is done by checking your blood sugar (glucose) after you have not eaten for a while (fasting). You may have this done every 1-3 years.  Discuss your test results, treatment options, and if necessary, the need for more tests with your health care provider. Vaccines Your health care provider may recommend certain vaccines, such as:  Influenza vaccine. This is recommended every year.  Tetanus, diphtheria, and acellular pertussis (Tdap, Td) vaccine. You may need a Td booster every 10 years.  Varicella vaccine. You may need this if you have not been vaccinated.  Zoster vaccine. You may need this after age 87.  Measles, mumps, and rubella (MMR) vaccine. You may need at least one dose of MMR if you were born in 1957 or later. You may also need a second dose.  Pneumococcal 13-valent conjugate (PCV13) vaccine. You may need this if you have certain conditions and have not been vaccinated.  Pneumococcal polysaccharide (PPSV23) vaccine. You may need one or two doses if you smoke cigarettes or if you have certain conditions.  Meningococcal vaccine. You may need this if you have certain conditions.  Hepatitis A vaccine. You may need this if you have certain  conditions or if you travel or work in places where you may be exposed to hepatitis A.  Hepatitis B vaccine. You may need this if you have certain conditions or if you travel or work in places where you may be exposed to hepatitis B.  Haemophilus influenzae type b (Hib) vaccine. You may need this if you have certain risk factors.  Talk to your health care provider  about which screenings and vaccines you need and how often you need them. This information is not intended to replace advice given to you by your health care provider. Make sure you discuss any questions you have with your health care provider. Document Released: 01/09/2016 Document Revised: 09/01/2016 Document Reviewed: 10/14/2015 Elsevier Interactive Patient Education  [employer].

## 2018-04-28 DIAGNOSIS — M79671 Pain in right foot: Secondary | ICD-10-CM | POA: Diagnosis not present

## 2018-04-28 DIAGNOSIS — B079 Viral wart, unspecified: Secondary | ICD-10-CM | POA: Diagnosis not present

## 2018-05-21 ENCOUNTER — Other Ambulatory Visit: Payer: Self-pay | Admitting: Medical

## 2018-08-10 DIAGNOSIS — M79671 Pain in right foot: Secondary | ICD-10-CM | POA: Diagnosis not present

## 2018-08-10 DIAGNOSIS — B079 Viral wart, unspecified: Secondary | ICD-10-CM | POA: Diagnosis not present

## 2018-09-01 ENCOUNTER — Ambulatory Visit: Payer: BLUE CROSS/BLUE SHIELD | Admitting: Medical

## 2018-09-05 ENCOUNTER — Other Ambulatory Visit: Payer: Self-pay | Admitting: Medical

## 2018-09-08 ENCOUNTER — Ambulatory Visit (HOSPITAL_BASED_OUTPATIENT_CLINIC_OR_DEPARTMENT_OTHER)
Admission: RE | Admit: 2018-09-08 | Discharge: 2018-09-08 | Disposition: A | Discharge status: Home / Self Care | Payer: BLUE CROSS/BLUE SHIELD | Source: Ambulatory Visit | Attending: Medical | Admitting: Medical

## 2018-09-08 ENCOUNTER — Encounter: Payer: Self-pay | Admitting: Medical

## 2018-09-08 ENCOUNTER — Ambulatory Visit (INDEPENDENT_AMBULATORY_CARE_PROVIDER_SITE_OTHER): Payer: BLUE CROSS/BLUE SHIELD | Admitting: Medical

## 2018-09-08 VITALS — BP 130/80 | HR 74 | Temp 98.1°F | Resp 16 | Ht 68.0 in | Wt 233.6 lb

## 2018-09-08 DIAGNOSIS — Z201 Contact with and (suspected) exposure to tuberculosis: Secondary | ICD-10-CM | POA: Diagnosis not present

## 2018-09-08 DIAGNOSIS — M25532 Pain in left wrist: Secondary | ICD-10-CM | POA: Diagnosis not present

## 2018-09-08 DIAGNOSIS — Z111 Encounter for screening for respiratory tuberculosis: Secondary | ICD-10-CM | POA: Diagnosis not present

## 2018-09-08 DIAGNOSIS — R5383 Other fatigue: Secondary | ICD-10-CM | POA: Diagnosis not present

## 2018-09-08 DIAGNOSIS — I10 Essential (primary) hypertension: Secondary | ICD-10-CM

## 2018-09-08 LAB — TSH: TSH: 1.35 u[IU]/mL (ref 0.35–4.50)

## 2018-09-08 LAB — VITAMIN B12: Vitamin B-12: 418 pg/mL (ref 211–911)

## 2018-09-08 LAB — VITAMIN D 25 HYDROXY (VIT D DEFICIENCY, FRACTURES): VITD: 18.2 ng/mL — ABNORMAL LOW (ref 30.00–100.00)

## 2018-09-08 MED ORDER — DICLOFENAC SODIUM 75 MG PO TBEC: 75.0000 mg | DELAYED_RELEASE_TABLET | Freq: Two times a day (BID) | ORAL | 0 refills | Status: DC

## 2018-09-08 NOTE — Progress Notes (Signed)
Subjective:    Patient ID: Ronald Sandoval, male    DOB: 1956-04-17, 62 y.o.   MRN: [id number]  HPI  Pt in for some lt wrist pain since mid august. He states feels tight and rigid at time. Pt is right handed. Pt states he tries alleve twice a day and it seems to help. No trauma or fall. 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 Pt does not report any worse pain at night or waking up with any hand numbess. Over the last week his pain has decreased.  Pt states his wife being treated for tb. She has positive skin test. Unclear what her chest xrayed showed. Pt wants to know if he need treatment. Pt has no cough,no fever, no chills or night sweats.  Pt also notes he has been fatigued. He states if gets less than 6 hours asleep feels tired. 5 days a weeks will get about 5 and half hours a week.   Review of Systems  Constitutional: Positive for fatigue. Negative for chills and fever.  HENT: Negative for congestion, ear discharge and facial swelling.   Respiratory: Negative for cough, chest tightness, shortness of breath and wheezing.   Cardiovascular: Negative for chest pain and palpitations.  Musculoskeletal: Negative for back pain.       Left wrist pain.  Skin: Negative for rash.  Neurological: Negative for dizziness, seizures and light-headedness.  Hematological: Negative for adenopathy. Does not bruise/bleed easily.  Psychiatric/Behavioral: Negative for behavioral problems and confusion. The patient is not nervous/anxious.     Past Medical History:  Diagnosis Date  . Allergy   . Hyperlipidemia      Social History   Socioeconomic History  . Marital status: Widowed    Spouse name: Not on file  . Number of children: Not on file  . Years of education: Not on file  . Highest education level: Not on file  Occupational History  . Not on file  Social Needs  . Financial resource strain: Not on file  . Food insecurity:    Worry: Not on file   Inability: Not on file  . Transportation needs:    Medical: Not on file    Non-medical: Not on file  Tobacco Use  . Smoking status: Never Smoker  . Smokeless tobacco: Never Used  Substance and Sexual Activity  . Alcohol use: No    Alcohol/week: 0.0 standard drinks  . Drug use: No  . Sexual activity: Not on file  Lifestyle  . Physical activity:    Days per week: Not on file    Minutes per session: Not on file  . Stress: Not on file  Relationships  . Social connections:    Talks on phone: Not on file    Gets together: Not on file    Attends religious service: Not on file    Active member of club or organization: Not on file    Attends meetings of clubs or organizations: Not on file    Relationship status: Not on file  . Intimate partner violence:    Fear of current or ex partner: Not on file    Emotionally abused: Not on file    Physically abused: Not on file    Forced sexual activity: Not on file  Other Topics Concern  . Not on file  Social History Narrative  . Not on file    Past Surgical History:  Procedure Laterality Date  . corn removal Left  2013     No family history on file.  Allergies  Allergen Reactions  . Lisinopril Swelling    Current Outpatient Medications on File Prior to Visit  Medication Sig Dispense Refill  . amLODipine (NORVASC) 10 MG tablet TAKE 1 TABLET BY MOUTH EVERY DAY 30 tablet 5  . atorvastatin (LIPITOR) 10 MG tablet TAKE 1 TABLET BY MOUTH EVERY DAY 90 tablet 0   No current facility-administered medications on file prior to visit.     BP 130/80   Pulse 74   Temp 98.1 F (36.7 C) (Oral)   Resp 16   Ht 5\' 8"  (1.727 m)   Wt 233 lb 9.6 oz (106 kg)   SpO2 99%   BMI 35.52 kg/m       Objective:   Physical Exam  General- No acute distress. Pleasant patient. Neck- Full range of motion, no jvd Lungs- Clear, even and unlabored. Heart- regular rate and rhythm. Neurologic- CNII- XII grossly intact.  Left wrist- faint pain on  palpation dorsal aspect. Most pain at base of thumb. + finklestein test. No redness or warmth. No swelling.      Assessment & Plan:  For your left wrist pain, I did place x-ray order today.  By exam and history it does appear that you have tenosynovitis of the wrist.  Pain more at the base of the thumb.  We did give you a thumb spica splint to use daily.  Also prescribed diclofenac to use.  Not to use any ibuprofen or Aleve over-the-counter while using diclofenac.  You describe some history of exposure to tuberculosis.  You have negative signs and symptoms of tuberculosis on review.  We will go ahead and do TB blood test but I would asked that you get your wife to clarify with the health department what would be protocol for you as you do have very close contact/potential exposure.  For fatigue, I did place orders to get TSH, B12, B1 and vitamin D level.  Your CBC and metabolic panel were normal in the summer.  Your blood pressure is well controlled today.  Continue current BP medications.  Follow-up in 10 to 14 days or as needed.  If your wrist pain is lingering despite the above treatment then would refer you to sports medicine.  Esperanza Richters, PA-C

## 2018-09-08 NOTE — Patient Instructions (Signed)
For your left wrist pain, I did place x-ray order today.  By exam and history it does appear that you have tenosynovitis of the wrist.  Pain more at the base of the thumb.  We did give you a thumb spica splint to use daily.  Also prescribed diclofenac to use.  Not to use any ibuprofen or Aleve over-the-counter while using diclofenac.  You describe some history of exposure to tuberculosis.  You have negative signs and symptoms of tuberculosis on review.  We will go ahead and do TB blood test but I would asked that you get your wife to clarify with the health department what would be protocol for you as you do have very close contact/potential exposure.  For fatigue, I did place orders to get TSH, B12, B1 and vitamin D level.  Your CBC and metabolic panel were normal in the summer.  Your blood pressure is well controlled today.  Continue current BP medications.  Follow-up in 10 to 14 days or as needed.  If your wrist pain is lingering despite the above treatment then would refer you to sports medicine.

## 2018-09-10 ENCOUNTER — Telehealth: Payer: Self-pay | Admitting: Medical

## 2018-09-10 MED ORDER — VITAMIN D (ERGOCALCIFEROL) 1.25 MG (50000 UNIT) PO CAPS: 50000.0000 [IU] | ORAL_CAPSULE | ORAL | 0 refills | Status: DC

## 2018-09-10 NOTE — Telephone Encounter (Signed)
rx vit D sent to pt pharmacy. 

## 2018-09-12 ENCOUNTER — Telehealth: Payer: Self-pay | Admitting: Medical

## 2018-09-12 MED ORDER — SILDENAFIL CITRATE 50 MG PO TABS: 50.0000 mg | ORAL_TABLET | Freq: Every day | ORAL | 0 refills | Status: DC | PRN

## 2018-09-12 NOTE — Telephone Encounter (Signed)
Rx viagra sent to pt pharmacy. 

## 2018-09-13 LAB — QUANTIFERON-TB GOLD PLUS
NIL: 0.05 [IU]/mL
QUANTIFERON-TB GOLD PLUS: NEGATIVE
TB1-NIL: 0.03 [IU]/mL
TB2-NIL: 0.02 [IU]/mL

## 2018-09-14 LAB — VITAMIN B1: VITAMIN B1 (THIAMINE): 8 nmol/L (ref 8–30)

## 2018-11-04 ENCOUNTER — Other Ambulatory Visit: Payer: Self-pay | Admitting: Medical

## 2018-11-14 ENCOUNTER — Other Ambulatory Visit: Payer: Self-pay | Admitting: Medical

## 2018-11-27 ENCOUNTER — Other Ambulatory Visit: Payer: Self-pay | Admitting: Medical

## 2018-12-02 ENCOUNTER — Other Ambulatory Visit: Payer: Self-pay | Admitting: Medical

## 2019-01-23 DIAGNOSIS — Z1211 Encounter for screening for malignant neoplasm of colon: Secondary | ICD-10-CM | POA: Diagnosis not present

## 2019-01-24 ENCOUNTER — Other Ambulatory Visit: Payer: Self-pay | Admitting: Medical

## 2019-01-24 ENCOUNTER — Ambulatory Visit (HOSPITAL_BASED_OUTPATIENT_CLINIC_OR_DEPARTMENT_OTHER)
Admission: RE | Admit: 2019-01-24 | Discharge: 2019-01-24 | Disposition: A | Discharge status: Home / Self Care | Payer: BLUE CROSS/BLUE SHIELD | Source: Ambulatory Visit | Attending: Medical | Admitting: Medical

## 2019-01-24 ENCOUNTER — Ambulatory Visit (INDEPENDENT_AMBULATORY_CARE_PROVIDER_SITE_OTHER): Payer: BLUE CROSS/BLUE SHIELD | Admitting: Medical

## 2019-01-24 ENCOUNTER — Encounter: Payer: Self-pay | Admitting: Medical

## 2019-01-24 VITALS — BP 139/80 | HR 77 | Temp 98.2°F | Resp 16 | Ht 68.0 in | Wt 237.6 lb

## 2019-01-24 DIAGNOSIS — G8929 Other chronic pain: Secondary | ICD-10-CM

## 2019-01-24 DIAGNOSIS — M25562 Pain in left knee: Secondary | ICD-10-CM

## 2019-01-24 DIAGNOSIS — M79609 Pain in unspecified limb: Secondary | ICD-10-CM | POA: Insufficient documentation

## 2019-01-24 DIAGNOSIS — M1712 Unilateral primary osteoarthritis, left knee: Secondary | ICD-10-CM | POA: Diagnosis not present

## 2019-01-24 NOTE — Patient Instructions (Signed)
For your chronic left region knee pain and popliteal region pain, will get x-rays of your left knee today and lower extremity ultrasound.  I want to assess joint spaces and evaluate if you might have a Baker's cyst versus DVT.  You can go down now to get the x-ray of the knee.  I did talk with radiology staff and they are aware I want the ultrasound done today.  See if you can get it done now or come back after 430.  However I do not want to delay getting ultrasound past today.  You can continue low-dose Aleve for your pain.  Your blood pressure is just below upper limit of excitability.  Please continue with your amlodipine.  NSAIDs can increase blood pressure so prefer that you just use 1 tablet of Aleve a day.  If both studies are negative but your pain persist then would consider referral to sports medicine since your pain has been present for 1 year.  Follow-up date to be determined after reviewing imaging studies.

## 2019-01-24 NOTE — Progress Notes (Signed)
Subjective:    Patient ID: Ronald Sandoval, male    DOB: 27-Dec-1956, 63 y.o.   MRN: [id number]  HPI  Pt in with some left knee pain. He states he has it for a while. He states back of knee feels. He states present for about a year. He states getting worse and having to take alleve. He thinks knee looks little larger than rt side. No trauma or fall.  No reported  sob, no pedal edema. No wheezing.   Pt notes one alleve is adequate to control the pain.   Review of Systems  Constitutional: Negative for chills, fatigue and fever.  Respiratory: Negative for cough, chest tightness, shortness of breath and wheezing.   Cardiovascular: Negative for chest pain and palpitations.  Gastrointestinal: Negative for abdominal pain.  Musculoskeletal:       Left leg pain/popliteal.  Skin: Negative for rash.  Hematological: Negative for adenopathy. Does not bruise/bleed easily.  Psychiatric/Behavioral: Negative for behavioral problems and confusion.    Past Medical History:  Diagnosis Date  . Allergy   . Hyperlipidemia      Social History   Socioeconomic History  . Marital status: Widowed    Spouse name: Not on file  . Number of children: Not on file  . Years of education: Not on file  . Highest education level: Not on file  Occupational History  . Not on file  Social Needs  . Financial resource strain: Not on file  . Food insecurity:    Worry: Not on file    Inability: Not on file  . Transportation needs:    Medical: Not on file    Non-medical: Not on file  Tobacco Use  . Smoking status: Never Smoker  . Smokeless tobacco: Never Used  Substance and Sexual Activity  . Alcohol use: No    Alcohol/week: 0.0 standard drinks  . Drug use: No  . Sexual activity: Not on file  Lifestyle  . Physical activity:    Days per week: Not on file    Minutes per session: Not on file  . Stress: Not on file  Relationships  . Social connections:    Talks on phone: Not on file    Gets  together: Not on file    Attends religious service: Not on file    Active member of club or organization: Not on file    Attends meetings of clubs or organizations: Not on file    Relationship status: Not on file  . Intimate partner violence:    Fear of current or ex partner: Not on file    Emotionally abused: Not on file    Physically abused: Not on file    Forced sexual activity: Not on file  Other Topics Concern  . Not on file  Social History Narrative  . Not on file    Past Surgical History:  Procedure Laterality Date  . corn removal Left 2013     No family history on file.  Allergies  Allergen Reactions  . Lisinopril Swelling    Current Outpatient Medications on File Prior to Visit  Medication Sig Dispense Refill  . amLODipine (NORVASC) 10 MG tablet TAKE 1 TABLET BY MOUTH EVERY DAY 90 tablet 1  . atorvastatin (LIPITOR) 10 MG tablet TAKE 1 TABLET BY MOUTH EVERY DAY 90 tablet 0  . sildenafil (VIAGRA) 50 MG tablet Take 1 tablet (50 mg total) by mouth daily as needed for erectile dysfunction. (Patient not taking: Reported on 01/24/2019) 10 tablet  0   No current facility-administered medications on file prior to visit.     BP (!) 142/78   Pulse 77   Temp 98.2 F (36.8 C) (Oral)   Resp 16   Ht 5\' 8"  (1.727 m)   Wt 237 lb 9.6 oz (107.8 kg)   SpO2 100%   BMI 36.13 kg/m       Objective:   Physical Exam  General Mental Status- Alert. General Appearance- Not in acute distress.   Chest and Lung Exam Auscultation: Breath Sounds:-Normal.  Cardiovascular Auscultation:Rythm- Regular. Murmurs & Other Heart Sounds:Auscultation of the heart reveals- No Murmurs.   Neurologic Cranial Nerve exam:- CN III-XII intact(No nystagmus), symmetric smile. Strength:- 5/5 equal and symmetric strength both upper and lower extremities.  Left lower ext- knee moderate swollen, no warmth, no instability, no crepitus on rom, negative homans sign. No pedal edema. Faint popliteal  bulge on palpation.      Assessment & Plan:  For your chronic left region knee pain and popliteal region pain, will get x-rays of your left knee today and lower extremity ultrasound.  I want to assess joint spaces and evaluate if you might have a Baker's cyst versus DVT.  You can go down now to get the x-ray of the knee.  I did talk with radiology staff and they are aware I want the ultrasound done today.  See if you can get it done now or come back after 430.  However I do not want to delay getting ultrasound past today.  You can continue low-dose Aleve for your pain.  Your blood pressure is just below upper limit of excitability.  Please continue with your amlodipine.  NSAIDs can increase blood pressure so prefer that you just use 1 tablet of Aleve a day.  If both studies are negative but your pain persist then would consider referral to sports medicine since your pain has been present for 1 year.  Follow-up date to be determined after reviewing imaging studies.

## 2019-02-22 ENCOUNTER — Other Ambulatory Visit: Payer: Self-pay | Admitting: Medical

## 2019-03-05 DIAGNOSIS — Z1211 Encounter for screening for malignant neoplasm of colon: Secondary | ICD-10-CM | POA: Diagnosis not present

## 2019-05-11 ENCOUNTER — Other Ambulatory Visit: Payer: Self-pay | Admitting: Medical

## 2019-05-17 ENCOUNTER — Other Ambulatory Visit: Payer: Self-pay | Admitting: Medical

## 2019-07-08 NOTE — Progress Notes (Signed)
Subjective:    Patient ID: Ronald Sandoval, male    DOB: January 05, 1956, 63 y.o.   MRN: [id number]  HPI  Pt in for cpe/wellness exam.   Pt is fasting.  Per epic review appears to up to date on vaccines. Reminded to get flu vaccine early fall/september.  On review of epic pt has not had colonoscopy. Pt states he got done with Dr. Collene Mares in May. Pt states told was clear and repeat study in 10 years.  Last psa 03/19/19/19 and normal.   Pt is getting exercise every other day walking. Walks about 50 minutes when he does walk.     Review of Systems  Constitutional: Negative for chills, fatigue and fever.  Respiratory: Negative for cough, chest tightness, shortness of breath and wheezing.   Cardiovascular: Negative for chest pain and palpitations.  Gastrointestinal: Negative for abdominal pain, blood in stool, nausea and vomiting.  Genitourinary: Negative for dysuria, flank pain, frequency and urgency.  Musculoskeletal: Negative for back pain, myalgias and neck stiffness.       Pt does mention 4 days out of week. He has pain in rt heel for about 30 minutes to one hour then it resolves. Pt states pain goes away then resolves. Does not take anything for the pain. Pt uses Dr. Orpah Melter inserts for about 1-2 years. He states this pain may be new.  Skin: Negative for rash.  Neurological: Negative for dizziness, tremors, seizures and weakness.  Hematological: Negative for adenopathy. Does not bruise/bleed easily.  Psychiatric/Behavioral: Negative for behavioral problems, decreased concentration and suicidal ideas.    Past Medical History:  Diagnosis Date  . Allergy   . Hyperlipidemia      Social History   Socioeconomic History  . Marital status: Widowed    Spouse name: Not on file  . Number of children: Not on file  . Years of education: Not on file  . Highest education level: Not on file  Occupational History  . Not on file  Social Needs  . Financial resource strain: Not on file   . Food insecurity    Worry: Not on file    Inability: Not on file  . Transportation needs    Medical: Not on file    Non-medical: Not on file  Tobacco Use  . Smoking status: Never Smoker  . Smokeless tobacco: Never Used  Substance and Sexual Activity  . Alcohol use: No    Alcohol/week: 0.0 standard drinks  . Drug use: No  . Sexual activity: Not on file  Lifestyle  . Physical activity    Days per week: Not on file    Minutes per session: Not on file  . Stress: Not on file  Relationships  . Social Herbalist on phone: Not on file    Gets together: Not on file    Attends religious service: Not on file    Active member of club or organization: Not on file    Attends meetings of clubs or organizations: Not on file    Relationship status: Not on file  . Intimate partner violence    Fear of current or ex partner: Not on file    Emotionally abused: Not on file    Physically abused: Not on file    Forced sexual activity: Not on file  Other Topics Concern  . Not on file  Social History Narrative  . Not on file    Past Surgical History:  Procedure Laterality Date  . corn  removal Left 2013     No family history on file.  Allergies  Allergen Reactions  . Lisinopril Swelling    Current Outpatient Medications on File Prior to Visit  Medication Sig Dispense Refill  . amLODipine (NORVASC) 10 MG tablet TAKE 1 TABLET BY MOUTH EVERY DAY 90 tablet 1  . atorvastatin (LIPITOR) 10 MG tablet TAKE 1 TABLET BY MOUTH EVERY DAY 90 tablet 0  . sildenafil (VIAGRA) 50 MG tablet Take 1 tablet (50 mg total) by mouth daily as needed for erectile dysfunction. (Patient not taking: Reported on 01/24/2019) 10 tablet 0   No current facility-administered medications on file prior to visit.     There were no vitals taken for this visit.      Objective:   Physical Exam  General Mental Status- Alert. General Appearance- Not in acute distress.   Skin General: Color- Normal Color.  Moisture- Normal Moisture.  Neck Carotid Arteries- Normal color. Moisture- Normal Moisture. No carotid bruits. No JVD.  Chest and Lung Exam Auscultation: Breath Sounds:-Normal.  Cardiovascular Auscultation:Rythm- Regular. Murmurs & Other Heart Sounds:Auscultation of the heart reveals- No Murmurs.  Abdomen Inspection:-Inspeection Normal. Palpation/Percussion:Note:No mass. Palpation and Percussion of the abdomen reveal- Non Tender, Non Distended + BS, no rebound or guarding.    Neurologic Cranial Nerve exam:- CN III-XII intact(No nystagmus), symmetric smile. Romberg Exam:- Negative.  Strength:- 5/5 equal and symmetric strength both upper and lower extremities.  Genital exam and rectal- pt declines.  Rt foot- on direct palpation heel. Mild pan.     Assessment & Plan:  For you wellness exam today I have ordered cbc, cmp, psa,  and lipid panel.  Vaccine up to date. Reminder to get flu vaccine early fall.  Recommend exercise and healthy diet.  We will let you know lab results as they come in.  Follow up date appointment will be determined after lab review.   Brief counsel on heel pain. Offered xrays he declined. Advised Dr. Oletta CohnSchol's.   Esperanza RichtersEdward Sheronica Corey, PA-C

## 2019-07-08 NOTE — Patient Instructions (Addendum)
For you wellness exam today I have ordered cbc, cmp, psa,  and lipid panel.  Vaccine up to date. Reminder to get flu vaccine early fall.  Recommend exercise and healthy diet.  We will let you know lab results as they come in.  Follow up date appointment will be determined after lab review.   Brief counsel on heel pain. Offered xrays he declined. Advised Dr. Fredderick Phenix.    Preventive Care 41-64 Years Old, Male Preventive care refers to lifestyle choices and visits with your health care provider that can promote health and wellness. This includes:  A yearly physical exam. This is also called an annual well check.  Regular dental and eye exams.  Immunizations.  Screening for certain conditions.  Healthy lifestyle choices, such as eating a healthy diet, getting regular exercise, not using drugs or products that contain nicotine and tobacco, and limiting alcohol use. What can I expect for my preventive care visit? Physical exam Your health care provider will check:  Height and weight. These may be used to calculate body mass index (BMI), which is a measurement that tells if you are at a healthy weight.  Heart rate and blood pressure.  Your skin for abnormal spots. Counseling Your health care provider may ask you questions about:  Alcohol, tobacco, and drug use.  Emotional well-being.  Home and relationship well-being.  Sexual activity.  Eating habits.  Work and work Statistician. What immunizations do I need?  Influenza (flu) vaccine  This is recommended every year. Tetanus, diphtheria, and pertussis (Tdap) vaccine  You may need a Td booster every 10 years. Varicella (chickenpox) vaccine  You may need this vaccine if you have not already been vaccinated. Zoster (shingles) vaccine  You may need this after age 51. Measles, mumps, and rubella (MMR) vaccine  You may need at least one dose of MMR if you were born in 1957 or later. You may also need a second dose.  Pneumococcal conjugate (PCV13) vaccine  You may need this if you have certain conditions and were not previously vaccinated. Pneumococcal polysaccharide (PPSV23) vaccine  You may need one or two doses if you smoke cigarettes or if you have certain conditions. Meningococcal conjugate (MenACWY) vaccine  You may need this if you have certain conditions. Hepatitis A vaccine  You may need this if you have certain conditions or if you travel or work in places where you may be exposed to hepatitis A. Hepatitis B vaccine  You may need this if you have certain conditions or if you travel or work in places where you may be exposed to hepatitis B. Haemophilus influenzae type b (Hib) vaccine  You may need this if you have certain risk factors. Human papillomavirus (HPV) vaccine  If recommended by your health care provider, you may need three doses over 6 months. You may receive vaccines as individual doses or as more than one vaccine together in one shot (combination vaccines). Talk with your health care provider about the risks and benefits of combination vaccines. What tests do I need? Blood tests  Lipid and cholesterol levels. These may be checked every 5 years, or more frequently if you are over 36 years old.  Hepatitis C test.  Hepatitis B test. Screening  Lung cancer screening. You may have this screening every year starting at age 74 if you have a 30-pack-year history of smoking and currently smoke or have quit within the past 15 years.  Prostate cancer screening. Recommendations will vary depending on your family  history and other risks.  Colorectal cancer screening. All adults should have this screening starting at age 69 and continuing until age 63. Your health care provider may recommend screening at age 13 if you are at increased risk. You will have tests every 1-10 years, depending on your results and the type of screening test.  Diabetes screening. This is done by checking  your blood sugar (glucose) after you have not eaten for a while (fasting). You may have this done every 1-3 years.  Sexually transmitted disease (STD) testing. Follow these instructions at home: Eating and drinking  Eat a diet that includes fresh fruits and vegetables, whole grains, lean protein, and low-fat dairy products.  Take vitamin and mineral supplements as recommended by your health care provider.  Do not drink alcohol if your health care provider tells you not to drink.  If you drink alcohol: ? Limit how much you have to 0-2 drinks a day. ? Be aware of how much alcohol is in your drink. In the [place] one drink equals one 12 oz bottle of beer (355 mL), one 5 oz glass of wine (148 mL), or one 1 oz glass of hard liquor (44 mL). Lifestyle  Take daily care of your teeth and gums.  Stay active. Exercise for at least 30 minutes on 5 or more days each week.  Do not use any products that contain nicotine or tobacco, such as cigarettes, e-cigarettes, and chewing tobacco. If you need help quitting, ask your health care provider.  If you are sexually active, practice safe sex. Use a condom or other form of protection to prevent STIs (sexually transmitted infections).  Talk with your health care provider about taking a low-dose aspirin every day starting at age 10. What's next?  Go to your health care provider once a year for a well check visit.  Ask your health care provider how often you should have your eyes and teeth checked.  Stay up to date on all vaccines. This information is not intended to replace advice given to you by your health care provider. Make sure you discuss any questions you have with your health care provider. Document Released: 01/09/2016 Document Revised: 12/07/2018 Document Reviewed: 12/07/2018 Elsevier Patient Education  2020 [employer].

## 2019-07-09 ENCOUNTER — Encounter: Payer: Self-pay | Admitting: Medical

## 2019-07-09 ENCOUNTER — Other Ambulatory Visit: Payer: Self-pay

## 2019-07-09 ENCOUNTER — Ambulatory Visit (INDEPENDENT_AMBULATORY_CARE_PROVIDER_SITE_OTHER): Payer: BC Managed Care – PPO | Admitting: Medical

## 2019-07-09 VITALS — BP 129/67 | HR 67 | Temp 98.0°F | Resp 16 | Ht 68.0 in | Wt 238.4 lb

## 2019-07-09 DIAGNOSIS — Z0000 Encounter for general adult medical examination without abnormal findings: Secondary | ICD-10-CM | POA: Diagnosis not present

## 2019-07-09 DIAGNOSIS — Z125 Encounter for screening for malignant neoplasm of prostate: Secondary | ICD-10-CM

## 2019-07-09 LAB — COMPREHENSIVE METABOLIC PANEL
ALT: 13 U/L (ref 0–53)
AST: 18 U/L (ref 0–37)
Albumin: 4 g/dL (ref 3.5–5.2)
Alkaline Phosphatase: 63 U/L (ref 39–117)
BUN: 13 mg/dL (ref 6–23)
CO2: 25 mEq/L (ref 19–32)
Calcium: 8.5 mg/dL (ref 8.4–10.5)
Chloride: 106 mEq/L (ref 96–112)
Creatinine, Ser: 1.22 mg/dL (ref 0.40–1.50)
GFR: 72.41 mL/min (ref >60.00)
Glucose, Bld: 83 mg/dL (ref 70–99)
Potassium: 4.1 mEq/L (ref 3.5–5.1)
Sodium: 140 mEq/L (ref 135–145)
Total Bilirubin: 0.8 mg/dL (ref 0.2–1.2)
Total Protein: 6.7 g/dL (ref 6.0–8.3)

## 2019-07-09 LAB — CBC WITH DIFFERENTIAL/PLATELET
Basophils Absolute: 0.1 10*3/uL (ref 0.0–0.1)
Basophils Relative: 1.3 % (ref 0.0–3.0)
Eosinophils Absolute: 0.2 10*3/uL (ref 0.0–0.7)
Eosinophils Relative: 3.1 % (ref 0.0–5.0)
HCT: 41.4 % (ref 39.0–52.0)
Hemoglobin: 13.7 g/dL (ref 13.0–17.0)
Lymphocytes Relative: 35.6 % (ref 12.0–46.0)
Lymphs Abs: 1.9 10*3/uL (ref 0.7–4.0)
MCHC: 33.1 g/dL (ref 30.0–36.0)
MCV: 86.6 fl (ref 78.0–100.0)
Monocytes Absolute: 0.6 10*3/uL (ref 0.1–1.0)
Monocytes Relative: 10.8 % (ref 3.0–12.0)
Neutro Abs: 2.6 10*3/uL (ref 1.4–7.7)
Neutrophils Relative %: 49.2 % (ref 43.0–77.0)
Platelets: 247 10*3/uL (ref 150.0–400.0)
RBC: 4.78 Mil/uL (ref 4.22–5.81)
RDW: 15.1 % (ref 11.5–15.5)
WBC: 5.3 10*3/uL (ref 4.0–10.5)

## 2019-07-09 LAB — LIPID PANEL
Cholesterol: 132 mg/dL (ref 0–200)
HDL: 44.1 mg/dL (ref >39.00)
LDL Cholesterol: 74 mg/dL (ref 0–99)
NonHDL: 87.45
Total CHOL/HDL Ratio: 3
Triglycerides: 67 mg/dL (ref 0.0–149.0)
VLDL: 13.4 mg/dL (ref 0.0–40.0)

## 2019-07-09 LAB — PSA: PSA: 0.47 ng/mL (ref 0.10–4.00)

## 2019-07-11 ENCOUNTER — Telehealth: Payer: Self-pay | Admitting: Medical

## 2019-07-11 NOTE — Telephone Encounter (Signed)
Noted and will review to determine if can fill or missing elements such as waist measurement for example or other item?

## 2019-07-11 NOTE — Telephone Encounter (Signed)
Pt dropped off document to be filled out by provider ( 1 page -[facility] Wellness Examination document) Pt would like to be called to pick up when ready at [phone] or to send a mychart message for pt to come to pick up document. Document put at front office tray under providers name.

## 2019-07-11 NOTE — Telephone Encounter (Signed)
Form placed in PCP's folder.

## 2019-07-19 NOTE — Telephone Encounter (Signed)
Pt notified form is ready for pickup.

## 2019-07-19 NOTE — Telephone Encounter (Signed)
Patient is calling to see if form is ready to be picked up. Please advise [phone]

## 2019-08-10 ENCOUNTER — Other Ambulatory Visit: Payer: Self-pay | Admitting: Medical

## 2019-10-17 ENCOUNTER — Encounter: Payer: Self-pay | Admitting: Medical

## 2019-10-17 ENCOUNTER — Ambulatory Visit (INDEPENDENT_AMBULATORY_CARE_PROVIDER_SITE_OTHER): Payer: BC Managed Care – PPO | Admitting: Medical

## 2019-10-17 ENCOUNTER — Ambulatory Visit (HOSPITAL_BASED_OUTPATIENT_CLINIC_OR_DEPARTMENT_OTHER)
Admission: RE | Admit: 2019-10-17 | Discharge: 2019-10-17 | Disposition: A | Discharge status: Home / Self Care | Payer: BC Managed Care – PPO | Source: Ambulatory Visit | Attending: Medical | Admitting: Medical

## 2019-10-17 ENCOUNTER — Other Ambulatory Visit: Payer: Self-pay

## 2019-10-17 VITALS — BP 134/73 | HR 74 | Temp 96.4°F | Resp 16 | Ht 68.0 in | Wt 240.0 lb

## 2019-10-17 DIAGNOSIS — M25472 Effusion, left ankle: Secondary | ICD-10-CM

## 2019-10-17 DIAGNOSIS — M7989 Other specified soft tissue disorders: Secondary | ICD-10-CM

## 2019-10-17 DIAGNOSIS — T148XXA Other injury of unspecified body region, initial encounter: Secondary | ICD-10-CM

## 2019-10-17 MED ORDER — DOXYCYCLINE HYCLATE 100 MG PO TABS: 100.0000 mg | ORAL_TABLET | Freq: Two times a day (BID) | ORAL | 0 refills | Status: DC

## 2019-10-17 NOTE — Patient Instructions (Addendum)
For recent left lower ext swelling, I placed [place] order to evaluate if you have DVT. Study at 2:30 today. Keep your cell phone on and volume up. Well need to contact you on result if positive for dvt.   If negative will likely give antibiotic for possible infection as swelling coincided with scrapes/abrasions against pallet.  Follow up date to be determined pending study result.

## 2019-10-17 NOTE — Addendum Note (Signed)
Addended by: Anabel Halon on: 10/17/2019 06:02 PM   Modules accepted: Orders

## 2019-10-17 NOTE — Progress Notes (Signed)
   Subjective:    Patient ID: Ronald Sandoval, male    DOB: [birth year], 63 y.o.   MRN: [id number]  HPI  Pt states left leg swollen in calf and left ankle. No fall or trauma. No long travel and non smoker. Recently wearing new safety shoes. No fever,no chills or sweats.  Pt had some swelling in the past but not as severe as presently. [place] negative back in January with prior milder signs/symptoms.  2 small scrapes left lateral calf last week at work. No dc from scrapes. Mild tender around area. Pt appears up to days on vaccine. Scrapes to skin around time     Review of Systems  Constitutional: Negative for chills, fatigue and fever.  Respiratory: Negative for cough, chest tightness, shortness of breath and wheezing.   Cardiovascular: Negative for chest pain and palpitations.  Gastrointestinal: Negative for abdominal pain.  Musculoskeletal:       Left calf and ankle swelling.       Objective:   Physical Exam  General- No acute distress. Pleasant patient. Neck- Full range of motion, no jvd Lungs- Clear, even and unlabored. Heart- regular rate and rhythm. Neurologic- CNII- XII grossly intact.  Left lower ext- large swollen, faint warm, tight per pt. Lateral calf 2 small scabs. No dc. Negative homan sign.      Assessment & Plan:  For recent left lower ext swelling, I placed [place] order to evaluate if you have DVT. Study at 2:30 today. Keep your cell phone on and volume up. Well need to contact you on result if positive for dvt.   If negative will likely give antibiotic for possible infection as swelling coincided with scrapes/abrasions against pallet.  Follow up date to be determined pending study result.   [phone]  25 minutes spent with pt. 50% of time spent counseling pt on plan going forward.  Mackie Pai, PA-C

## 2019-10-18 ENCOUNTER — Other Ambulatory Visit: Payer: Self-pay

## 2019-10-19 ENCOUNTER — Encounter: Payer: Self-pay | Admitting: Medical

## 2019-10-19 ENCOUNTER — Ambulatory Visit (INDEPENDENT_AMBULATORY_CARE_PROVIDER_SITE_OTHER): Payer: BC Managed Care – PPO | Admitting: Medical

## 2019-10-19 VITALS — BP 151/76 | HR 82 | Temp 96.1°F | Resp 16 | Ht 68.0 in | Wt 241.0 lb

## 2019-10-19 DIAGNOSIS — L089 Local infection of the skin and subcutaneous tissue, unspecified: Secondary | ICD-10-CM | POA: Diagnosis not present

## 2019-10-19 DIAGNOSIS — R600 Localized edema: Secondary | ICD-10-CM | POA: Diagnosis not present

## 2019-10-19 NOTE — Progress Notes (Signed)
Subjective:    Patient ID: Ronald Sandoval, male    DOB: April 23, 1956, 63 y.o.   MRN: [id number]  HPI   Pt in for follow up.   Patient had some recent left lower extremity swelling for which on last visit he had left lower extremity ultrasound.  No DVT was seen.  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  Radiologist made the comment that if he has persistent or worsening symptoms then consider imaging of iliac veins.  So I wanted patient to follow-up today to see how he is doing.  On last visit I did give him an antibiotic to start since he noted swelling of his leg coincided with getting some abrasions at work.  He started antibiotic yesterday morning.  Patient states that his leg does feel a little bit better.  He thinks that it is less swollen particularly in the ankle area.  No shortness of breath or fever.     Review of Systems  Constitutional: Negative for chills, fatigue and fever.  Respiratory: Negative for cough, shortness of breath and wheezing.   Cardiovascular: Negative for chest pain and palpitations.  Gastrointestinal: Negative for abdominal pain.  Musculoskeletal:       See HPI.  Hematological: Negative for adenopathy.    Past Medical History:  Diagnosis Date  . Allergy   . Hyperlipidemia      Social History   Socioeconomic History  . Marital status: Widowed    Spouse name: Not on file  . Number of children: Not on file  . Years of education: Not on file  . Highest education level: Not on file  Occupational History  . Not on file  Social Needs  . Financial resource strain: Not on file  . Food insecurity    Worry: Not on file    Inability: Not on file  . Transportation needs    Medical: Not on file    Non-medical: Not on file  Tobacco Use  . Smoking status: Never Smoker  . Smokeless tobacco: Never Used  Substance and Sexual Activity  . Alcohol use: No    Alcohol/week: 0.0 standard drinks  . Drug use: No  . Sexual activity: Not on file   Lifestyle  . Physical activity    Days per week: Not on file    Minutes per session: Not on file  . Stress: Not on file  Relationships  . Social Herbalist on phone: Not on file    Gets together: Not on file    Attends religious service: Not on file    Active member of club or organization: Not on file    Attends meetings of clubs or organizations: Not on file    Relationship status: Not on file  . Intimate partner violence    Fear of current or ex partner: Not on file    Emotionally abused: Not on file    Physically abused: Not on file    Forced sexual activity: Not on file  Other Topics Concern  . Not on file  Social History Narrative  . Not on file    Past Surgical History:  Procedure Laterality Date  . corn removal Left 2013     No family history on file.  Allergies  Allergen Reactions  . Lisinopril Swelling    Current Outpatient Medications on File Prior to Visit  Medication Sig Dispense Refill  . amLODipine (NORVASC) 10 MG tablet TAKE 1 TABLET BY MOUTH EVERY DAY  90 tablet 1  . atorvastatin (LIPITOR) 10 MG tablet TAKE 1 TABLET BY MOUTH EVERY DAY 90 tablet 0  . doxycycline (VIBRA-TABS) 100 MG tablet Take 1 tablet (100 mg total) by mouth 2 (two) times daily. Can give caps or generic. 20 tablet 0  . sildenafil (VIAGRA) 50 MG tablet Take 1 tablet (50 mg total) by mouth daily as needed for erectile dysfunction. 10 tablet 0   No current facility-administered medications on file prior to visit.     BP (!) 151/76   Pulse 82   Temp (!) 96.1 F (35.6 C) (Temporal)   Resp 16   Ht 5\' 8"  (1.727 m)   Wt 241 lb (109.3 kg)   SpO2 100%   BMI 36.64 kg/m       Objective:   Physical Exam  General- No acute distress. Pleasant patient. Neck- Full range of motion, no jvd Lungs- Clear, even and unlabored. Heart- regular rate and rhythm. Neurologic- CNII- XII grossly intact.  Left lower extremity-on management of left mid calf area measurement is 18.5  inches.  On measurement of right calf area measurement is about 18-1/4 inch.  On inspection it looks symmetric.  On palpation of left lateral calf area near the 2 abrasions this area feels softer.  Last time it felt a little bit indurated.  No warmth today on palpation.  Left ankle and foot area no longer looks swollen.  Patient has normal pulses left lower extremity.  Sensation intact.      Assessment & Plan:  I do think presently the swelling etiology favors more skin infection.  Negative left lower extremity Doppler recently.  States she did respond to antibiotic and have incremental improvement I do not think imaging of iliac veins is indicated presently.  If you have any recurrent swelling as before then would try to order CT venogram will refer to specialist.  If you have any shortness of breath or severe swelling over the weekend then recommend ED evaluation.  Follow-up in 5 days or as needed.  I recommend continuing antibiotic and when not working use your compression hose socks.  Try to elevate legs when possible as well.  25 minutes spent with pt. 50% of time spent counseling pt on plan going forward.  , PA-C

## 2019-10-19 NOTE — Patient Instructions (Signed)
I do think presently the swelling etiology favors more skin infection.  Negative left lower extremity Doppler recently.  States she did respond to antibiotic and have incremental improvement I do not think imaging of iliac veins is indicated presently.  If you have any recurrent swelling as before then would try to order CT venogram will refer to specialist.  If you have any shortness of breath or severe swelling over the weekend then recommend ED evaluation.  Follow-up in 5 days or as needed.  I recommend continuing antibiotic and when not working use your compression hose socks.  Try to elevate legs when possible as well.

## 2019-10-24 ENCOUNTER — Telehealth: Payer: Self-pay | Admitting: *Deleted

## 2019-10-24 NOTE — Telephone Encounter (Signed)
Just asked for update since swelling was bit prominent with bit atypical history so wanted to confirm that it got better with antibiotic. Glad to hear condition resolved. Just clarify degree of improvement. Is it like 90% better. Barely noticeable swelling. If he wants I could look at leg on Friday.

## 2019-10-24 NOTE — Telephone Encounter (Signed)
Copied from [place] [id number]. Topic: General - Other >> Oct 24, 2019 12:04 PM Rainey Pines A wrote: Patient was advised to call and inform provider of progress. Patient stated that his ankle and leg  has gone down and that he is doing okay.  Please advise.

## 2019-10-25 NOTE — Telephone Encounter (Signed)
Pt has been scheduled for Friday 10/30 for leg examination.

## 2019-10-25 NOTE — Telephone Encounter (Signed)
Left message on machine to call back  [place] for pec to discuss

## 2019-10-26 ENCOUNTER — Ambulatory Visit: Payer: BC Managed Care – PPO | Admitting: Medical

## 2019-10-26 ENCOUNTER — Other Ambulatory Visit: Payer: Self-pay

## 2019-10-29 ENCOUNTER — Ambulatory Visit (INDEPENDENT_AMBULATORY_CARE_PROVIDER_SITE_OTHER): Payer: BC Managed Care – PPO | Admitting: Medical

## 2019-10-29 ENCOUNTER — Encounter: Payer: Self-pay | Admitting: Medical

## 2019-10-29 ENCOUNTER — Other Ambulatory Visit: Payer: Self-pay

## 2019-10-29 VITALS — BP 145/78 | HR 73 | Temp 96.1°F | Resp 16 | Ht 68.0 in | Wt 237.6 lb

## 2019-10-29 DIAGNOSIS — I871 Compression of vein: Secondary | ICD-10-CM | POA: Diagnosis not present

## 2019-10-29 DIAGNOSIS — R600 Localized edema: Secondary | ICD-10-CM | POA: Diagnosis not present

## 2019-10-29 NOTE — Patient Instructions (Signed)
   You do appear to be some improved but still have moderate degree of swelling/ 2+ edema still present.  Ultrasound was negative for left lower extremity DVT but radiologist did make comments about getting iliac vein imaging in the event of persisting or worsening symptoms.  Radiologist that I talked with mentioned CT venogram studies.  So I did go ahead and place that order in and will see if insurance authorizes the study.  If not then will refer you to a vein specialist.  During the interim while we wait for authorization or if I have to refer you to vein specialist would recommend ED evaluation in the event of any severe leg swelling or any shortness of breath.  I do not think any antibiotics are needed presently.  Small abrasion area looks much better.  Follow-up date to be determined after we see if imaging study authorized.

## 2019-10-29 NOTE — Progress Notes (Signed)
Subjective:    Patient ID: Ronald Sandoval, male    DOB: 1956-02-11, 63 y.o.   MRN: [id number]  HPI Pt in for follow up. Pt has no shortness of breath or wheezing. He has less swelling to his calf. No pain in calf. He states swelling was severe and tight sensation to calf before. But some improved.  Prior US showed no dvt.   Impression note reads.  1. No femoropopliteal and no calf DVT in the visualized calf veins. If clinical symptoms are inconsistent or if there are persistent or worsening symptoms, further imaging (possibly involving the iliac veins) may be warranted. 2. Left Baker's cyst  Gave antibiotic and area of abrasion looks better but not clear this was reason for swelling.  Wanted to see him back in light of radiologist impression remarks on iliac vein imaging.     Review of Systems  Constitutional: Negative for chills, fatigue and fever.  Respiratory: Negative for chest tightness and wheezing.   Cardiovascular: Negative for palpitations.  Musculoskeletal:       See hpi and exam.  Neurological: Negative for dizziness, seizures, weakness and headaches.  Hematological: Does not bruise/bleed easily.    Past Medical History:  Diagnosis Date  . Allergy   . Hyperlipidemia      Social History   Socioeconomic History  . Marital status: Widowed    Spouse name: Not on file  . Number of children: Not on file  . Years of education: Not on file  . Highest education level: Not on file  Occupational History  . Not on file  Social Needs  . Financial resource strain: Not on file  . Food insecurity    Worry: Not on file    Inability: Not on file  . Transportation needs    Medical: Not on file    Non-medical: Not on file  Tobacco Use  . Smoking status: Never Smoker  . Smokeless tobacco: Never Used  Substance and Sexual Activity  . Alcohol use: No    Alcohol/week: 0.0 standard drinks  . Drug use: No  . Sexual activity: Not on file  Lifestyle  . Physical  activity    Days per week: Not on file    Minutes per session: Not on file  . Stress: Not on file  Relationships  . Social Herbalist on phone: Not on file    Gets together: Not on file    Attends religious service: Not on file    Active member of club or organization: Not on file    Attends meetings of clubs or organizations: Not on file    Relationship status: Not on file  . Intimate partner violence    Fear of current or ex partner: Not on file    Emotionally abused: Not on file    Physically abused: Not on file    Forced sexual activity: Not on file  Other Topics Concern  . Not on file  Social History Narrative  . Not on file    Past Surgical History:  Procedure Laterality Date  . corn removal Left 2013     No family history on file.  Allergies  Allergen Reactions  . Lisinopril Swelling    Current Outpatient Medications on File Prior to Visit  Medication Sig Dispense Refill  . amLODipine (NORVASC) 10 MG tablet TAKE 1 TABLET BY MOUTH EVERY DAY 90 tablet 1  . atorvastatin (LIPITOR) 10 MG tablet TAKE 1 TABLET BY MOUTH EVERY DAY  90 tablet 0  . doxycycline (VIBRA-TABS) 100 MG tablet Take 1 tablet (100 mg total) by mouth 2 (two) times daily. Can give caps or generic. 20 tablet 0  . sildenafil (VIAGRA) 50 MG tablet Take 1 tablet (50 mg total) by mouth daily as needed for erectile dysfunction. 10 tablet 0   No current facility-administered medications on file prior to visit.     BP (!) 145/78   Pulse 73   Temp (!) 96.1 F (35.6 C) (Temporal)   Resp 16   Ht 5\' 8"  (1.727 m)   Wt 237 lb 9.6 oz (107.8 kg)   SpO2 100%   BMI 36.13 kg/m       Objective:   Physical Exam  General- No acute distress. Pleasant patient. Neck- Full range of motion, no jvd Lungs- Clear, even and unlabored. Heart- regular rate and rhythm. Neurologic- CNII- XII grossly intact.  Left lower ext- moderate swollen calf but less than before. 1+ pedal edema. No warmth. negtive homan  sign. Foot sensation intact. Posterior tibial and dorsal pedis pulse intact. Normal capillary refill.      Assessment & Plan:  [phone].  You do appear to be some improved but still have moderate degree of swelling/ 2+ edema still present.  Ultrasound was negative for left lower extremity DVT but radiologist did make comments about getting iliac vein imaging in the event of persisting or worsening symptoms.  Radiologist that I talked with mentioned CT venogram studies.  So I did go ahead and place that order in and will see if insurance authorizes the study.  If not then will refer you to a vein specialist.  During the interim while we wait for authorization or if I have to refer you to vein specialist would recommend ED evaluation in the event of any severe leg swelling or any shortness of breath.  I do not think any antibiotics are needed presently.  Small abrasion area looks much better.  Follow-up date to be determined after we see if imaging study authorized.   25 minutes spent with pt. 50% of time spent counseling pt on plan going forward. [phone], PA-C

## 2019-10-31 NOTE — Addendum Note (Signed)
Addended by: Anabel Halon on: 10/31/2019 11:20 AM   Modules accepted: Orders

## 2019-11-01 ENCOUNTER — Other Ambulatory Visit: Payer: Self-pay | Admitting: Medical

## 2019-11-02 ENCOUNTER — Other Ambulatory Visit: Payer: Self-pay | Admitting: Medical

## 2019-11-06 ENCOUNTER — Ambulatory Visit (HOSPITAL_COMMUNITY): Payer: BC Managed Care – PPO

## 2019-11-09 ENCOUNTER — Other Ambulatory Visit (HOSPITAL_COMMUNITY): Payer: Self-pay | Admitting: Medical

## 2019-11-09 ENCOUNTER — Other Ambulatory Visit: Payer: Self-pay

## 2019-11-09 ENCOUNTER — Ambulatory Visit (HOSPITAL_COMMUNITY)
Admission: RE | Admit: 2019-11-09 | Discharge: 2019-11-09 | Disposition: A | Discharge status: Home / Self Care | Payer: BC Managed Care – PPO | Source: Ambulatory Visit | Attending: Medical | Admitting: Medical

## 2019-11-09 DIAGNOSIS — R600 Localized edema: Secondary | ICD-10-CM | POA: Diagnosis not present

## 2019-11-09 DIAGNOSIS — N281 Cyst of kidney, acquired: Secondary | ICD-10-CM | POA: Diagnosis not present

## 2019-11-09 DIAGNOSIS — I871 Compression of vein: Secondary | ICD-10-CM | POA: Insufficient documentation

## 2019-11-09 LAB — POCT I-STAT CREATININE: Creatinine, Ser: 1.1 mg/dL (ref 0.61–1.24)

## 2019-11-09 MED ORDER — IOHEXOL 350 MG/ML SOLN: 100.0000 mL | Freq: Once | INTRAVENOUS | Status: DC | PRN

## 2019-11-09 MED ORDER — IOHEXOL 350 MG/ML SOLN
150.0000 mL | Freq: Once | INTRAVENOUS | Status: AC | PRN
  Administered 2019-11-09: 16:00:00 150 mL via INTRAVENOUS

## 2019-11-12 ENCOUNTER — Telehealth: Payer: Self-pay

## 2019-11-12 NOTE — Telephone Encounter (Signed)
Copied from [place] [id number]. Topic: Quick Communication - Other Results (Clinic Use ONLY) >> Nov 12, 2019  7:30 AM Scherrie Gerlach wrote: Pt would like to know if he needs appt to go over his CT results?  Pt states his leg is still swollen and he does not know what to do about it?

## 2019-11-12 NOTE — Telephone Encounter (Signed)
Pt scheduled for tomorrow

## 2019-11-13 ENCOUNTER — Encounter: Payer: Self-pay | Admitting: Medical

## 2019-11-13 ENCOUNTER — Ambulatory Visit (HOSPITAL_BASED_OUTPATIENT_CLINIC_OR_DEPARTMENT_OTHER)
Admission: RE | Admit: 2019-11-13 | Discharge: 2019-11-13 | Disposition: A | Discharge status: Home / Self Care | Payer: BC Managed Care – PPO | Source: Ambulatory Visit | Attending: Medical | Admitting: Medical

## 2019-11-13 ENCOUNTER — Ambulatory Visit (INDEPENDENT_AMBULATORY_CARE_PROVIDER_SITE_OTHER): Payer: BC Managed Care – PPO | Admitting: Medical

## 2019-11-13 ENCOUNTER — Other Ambulatory Visit: Payer: Self-pay

## 2019-11-13 VITALS — BP 135/73 | HR 72 | Temp 96.8°F | Resp 16 | Ht 68.0 in | Wt 237.6 lb

## 2019-11-13 DIAGNOSIS — J811 Chronic pulmonary edema: Secondary | ICD-10-CM | POA: Diagnosis not present

## 2019-11-13 DIAGNOSIS — R600 Localized edema: Secondary | ICD-10-CM | POA: Diagnosis not present

## 2019-11-13 DIAGNOSIS — R918 Other nonspecific abnormal finding of lung field: Secondary | ICD-10-CM | POA: Insufficient documentation

## 2019-11-13 DIAGNOSIS — M7122 Synovial cyst of popliteal space [Baker], left knee: Secondary | ICD-10-CM | POA: Diagnosis not present

## 2019-11-13 LAB — COMPREHENSIVE METABOLIC PANEL
ALT: 22 U/L (ref 0–53)
AST: 26 U/L (ref 0–37)
Albumin: 3.9 g/dL (ref 3.5–5.2)
Alkaline Phosphatase: 71 U/L (ref 39–117)
BUN: 11 mg/dL (ref 6–23)
CO2: 26 mEq/L (ref 19–32)
Calcium: 8.5 mg/dL (ref 8.4–10.5)
Chloride: 107 mEq/L (ref 96–112)
Creatinine, Ser: 1.12 mg/dL (ref 0.40–1.50)
GFR: 79.83 mL/min (ref >60.00)
Glucose, Bld: 105 mg/dL — ABNORMAL HIGH (ref 70–99)
Potassium: 3.6 mEq/L (ref 3.5–5.1)
Sodium: 139 mEq/L (ref 135–145)
Total Bilirubin: 0.8 mg/dL (ref 0.2–1.2)
Total Protein: 6.7 g/dL (ref 6.0–8.3)

## 2019-11-13 LAB — BRAIN NATRIURETIC PEPTIDE: Pro B Natriuretic peptide (BNP): 16 pg/mL (ref 0.0–100.0)

## 2019-11-13 NOTE — Addendum Note (Signed)
Addended by: Anabel Halon on: 11/13/2019 10:28 AM   Modules accepted: Orders

## 2019-11-13 NOTE — Patient Instructions (Signed)
Your work-up thus far for left lower extremity edema did not show any vascular cause.  At this point will recommend that you get compression socks and use daily as well as elevate your legs.  You do have known Baker's cyst and if area behind her knee is becoming more uncomfortable then would refer you to orthopedist.  With your persisting edema I do think it would be best to go ahead and get chest x-ray today and BNP blood work.  In addition I want to see if radiologist still reports groundglass type appearance in the lower lungs.  We will see what radiologist states and might consider referral to lung doctor if significant abnormality noted.  We discussed other incidental findings CT as well.  Follow-up in 44months fasting to check lipid panel.  Reminded to continue with atorvastatin.

## 2019-11-13 NOTE — Progress Notes (Signed)
Subjective:    Patient ID: Ronald Sandoval, male    DOB: [birth year], 64 y.o.   MRN: [id number]  HPI  Pt in for follow up.  Pt still has swelling of left leg. Hx of baker cyst. No cvt and ct abd pelvis showed abnormality of pelvic vessel system. No sob, no wheezing and no orthopnea.  Answered pt questions and reviewed his ct abd/pelvis finding.   Some ground glass opacity on imaging. Pt has no history of smoking.   Review of Systems  Constitutional: Negative for chills, fatigue and fever.  Respiratory: Negative for chest tightness, shortness of breath and wheezing.   Cardiovascular: Negative for chest pain and palpitations.  Gastrointestinal: Negative for abdominal pain.  Genitourinary: Negative for dysuria.  Musculoskeletal: Negative for back pain, gait problem and neck pain.  Skin: Negative for rash.  Neurological: Negative for dizziness, seizures, syncope, weakness and light-headedness.  Hematological: Negative for adenopathy. Does not bruise/bleed easily.  Psychiatric/Behavioral: Negative for behavioral problems, confusion and sleep disturbance. The patient is not nervous/anxious.     Past Medical History:  Diagnosis Date  . Allergy   . Hyperlipidemia      Social History   Socioeconomic History  . Marital status: Widowed    Spouse name: Not on file  . Number of children: Not on file  . Years of education: Not on file  . Highest education level: Not on file  Occupational History  . Not on file  Social Needs  . Financial resource strain: Not on file  . Food insecurity    Worry: Not on file    Inability: Not on file  . Transportation needs    Medical: Not on file    Non-medical: Not on file  Tobacco Use  . Smoking status: Never Smoker  . Smokeless tobacco: Never Used  Substance and Sexual Activity  . Alcohol use: No    Alcohol/week: 0.0 standard drinks  . Drug use: No  . Sexual activity: Not on file  Lifestyle  . Physical activity    Days per week:  Not on file    Minutes per session: Not on file  . Stress: Not on file  Relationships  . Social Herbalist on phone: Not on file    Gets together: Not on file    Attends religious service: Not on file    Active member of club or organization: Not on file    Attends meetings of clubs or organizations: Not on file    Relationship status: Not on file  . Intimate partner violence    Fear of current or ex partner: Not on file    Emotionally abused: Not on file    Physically abused: Not on file    Forced sexual activity: Not on file  Other Topics Concern  . Not on file  Social History Narrative  . Not on file    Past Surgical History:  Procedure Laterality Date  . corn removal Left 2013     No family history on file.  Allergies  Allergen Reactions  . Lisinopril Swelling    Current Outpatient Medications on File Prior to Visit  Medication Sig Dispense Refill  . amLODipine (NORVASC) 10 MG tablet TAKE 1 TABLET BY MOUTH EVERY DAY 90 tablet 1  . atorvastatin (LIPITOR) 10 MG tablet TAKE 1 TABLET BY MOUTH EVERY DAY 90 tablet 0  . doxycycline (VIBRA-TABS) 100 MG tablet Take 1 tablet (100 mg total) by mouth 2 (two) times daily.  Can give caps or generic. 20 tablet 0  . sildenafil (VIAGRA) 50 MG tablet Take 1 tablet (50 mg total) by mouth daily as needed for erectile dysfunction. 10 tablet 0   No current facility-administered medications on file prior to visit.     BP 135/73   Pulse 72   Temp (!) 96.8 F (36 C) (Temporal)   Resp 16   Ht 5\' 8"  (1.727 m)   Wt 237 lb 9.6 oz (107.8 kg)   SpO2 100%   BMI 36.13 kg/m       Objective:   Physical Exam  General- No acute distress. Pleasant patient. Neck- Full range of motion, no jvd Lungs- Clear, even and unlabored. Heart- regular rate and rhythm. Neurologic- CNII- XII grossly intact. Left lower ext- same level of edema as before. No warmth, no tenderness, and negative  homans sign.      Assessment & Plan:  Your  work-up thus far for left lower extremity edema did not show any vascular cause.  At this point will recommend that you get compression socks and use daily as well as elevate your legs.  You do have known Baker's cyst and if area behind her knee is becoming more uncomfortable then would refer you to orthopedist.  With your persisting edema I do think it would be best to go ahead and get chest x-ray today and BNP blood work.  In addition I want to see if radiologist still reports groundglass type appearance in the lower lungs.  We will see what radiologist states and might consider referral to lung doctor if significant abnormality noted.  We discussed other incidental findings CT as well.  Follow-up in 76months fasting to check lipid panel.  Reminded to continue with atorvastatin.  Demmi Sindt, PA-C   25 minutes spent with pt. 50% of time spent counseling pt on plan going forward and answering pt questions.

## 2019-11-28 DIAGNOSIS — I10 Essential (primary) hypertension: Secondary | ICD-10-CM | POA: Diagnosis not present

## 2019-11-28 DIAGNOSIS — Z20828 Contact with and (suspected) exposure to other viral communicable diseases: Secondary | ICD-10-CM | POA: Diagnosis not present

## 2019-11-28 DIAGNOSIS — E7800 Pure hypercholesterolemia, unspecified: Secondary | ICD-10-CM | POA: Diagnosis not present

## 2019-11-28 DIAGNOSIS — Z03818 Encounter for observation for suspected exposure to other biological agents ruled out: Secondary | ICD-10-CM | POA: Diagnosis not present

## 2019-11-28 DIAGNOSIS — Z9189 Other specified personal risk factors, not elsewhere classified: Secondary | ICD-10-CM | POA: Diagnosis not present

## 2020-01-24 ENCOUNTER — Other Ambulatory Visit: Payer: Self-pay | Admitting: Medical

## 2020-03-26 DIAGNOSIS — Z131 Encounter for screening for diabetes mellitus: Secondary | ICD-10-CM | POA: Diagnosis not present

## 2020-03-26 DIAGNOSIS — Z114 Encounter for screening for human immunodeficiency virus [HIV]: Secondary | ICD-10-CM | POA: Diagnosis not present

## 2020-03-26 DIAGNOSIS — M7122 Synovial cyst of popliteal space [Baker], left knee: Secondary | ICD-10-CM | POA: Diagnosis not present

## 2020-03-26 DIAGNOSIS — I10 Essential (primary) hypertension: Secondary | ICD-10-CM | POA: Diagnosis not present

## 2020-03-26 DIAGNOSIS — Z136 Encounter for screening for cardiovascular disorders: Secondary | ICD-10-CM | POA: Diagnosis not present

## 2020-03-26 DIAGNOSIS — Z125 Encounter for screening for malignant neoplasm of prostate: Secondary | ICD-10-CM | POA: Diagnosis not present

## 2020-04-03 DIAGNOSIS — Z0189 Encounter for other specified special examinations: Secondary | ICD-10-CM | POA: Diagnosis not present

## 2020-07-18 IMAGING — US US EXTREM LOW VENOUS*L*
1 series · 13 of 24 positions shown · non-contrast
Comparison: 01/24/2019

CLINICAL DATA: Swelling x 1.5 weeks

EXAM:
LEFT LOWER EXTREMITY VENOUS DOPPLER ULTRASOUND
TECHNIQUE: Gray-scale sonography with compression, as well as color and duplex
ultrasound, were performed to evaluate the deep venous system from
the level of the common femoral vein through the popliteal and
proximal calf veins.

[Series 1: us extrem low venous*left* · 13 of 43 slices shown]
[im 1/43]
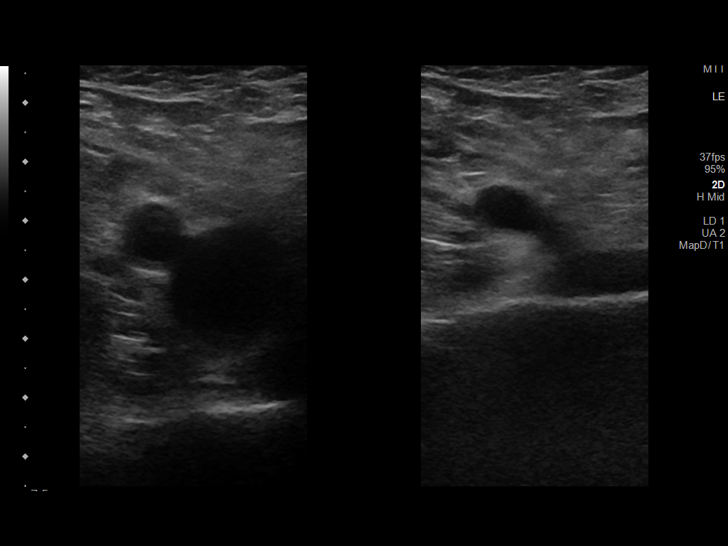
[im 4/43]
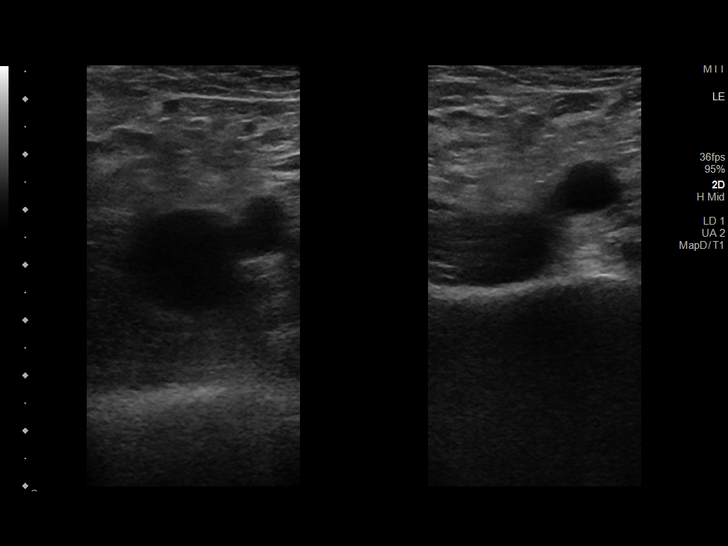
[im 8/43]
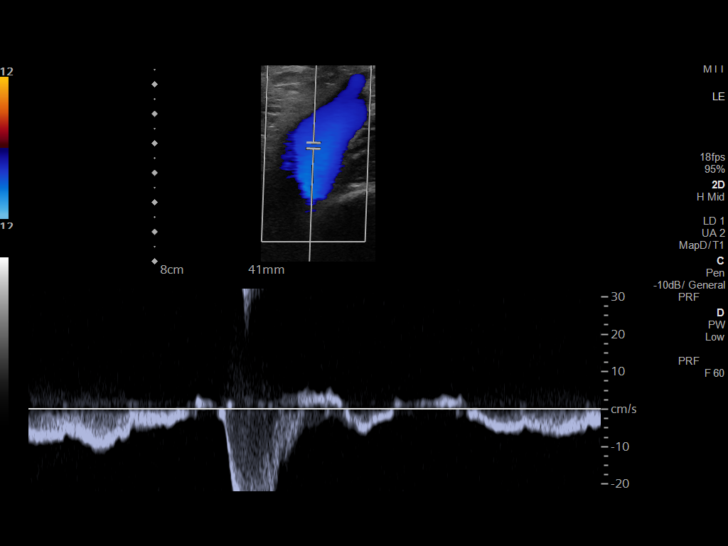
[im 11/43]
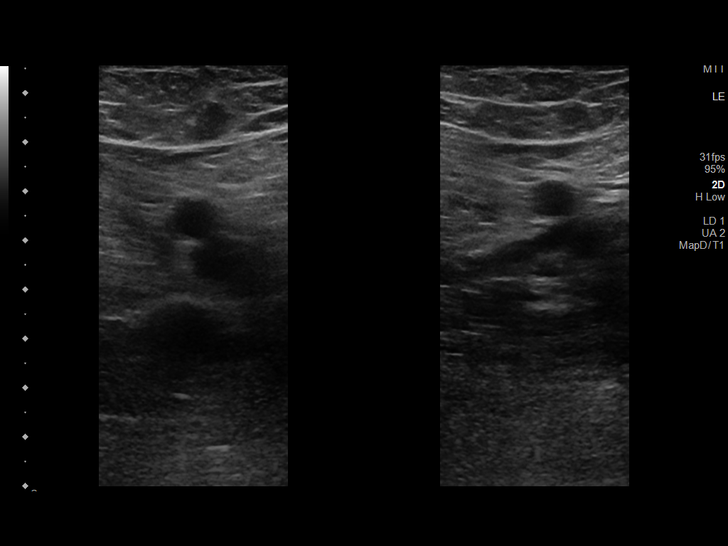
[im 15/43]
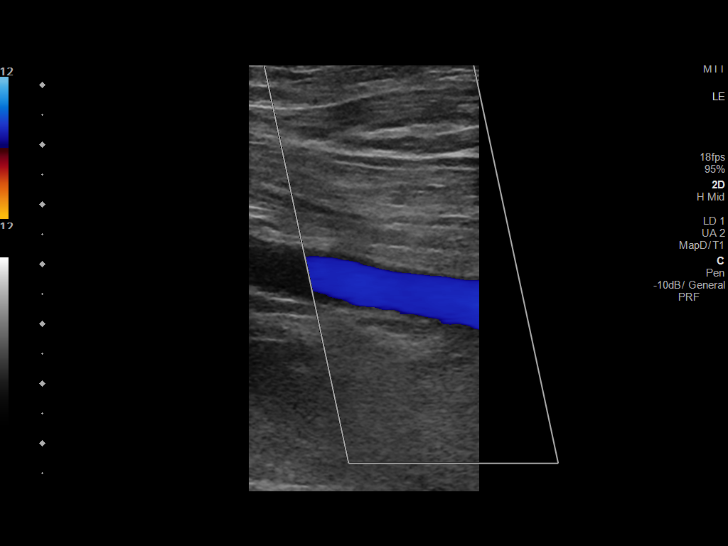
[im 19/43]
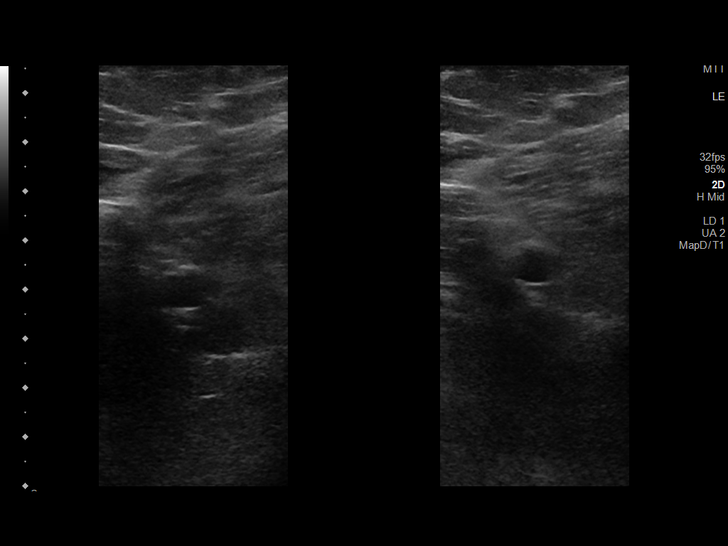
[im 22/43]
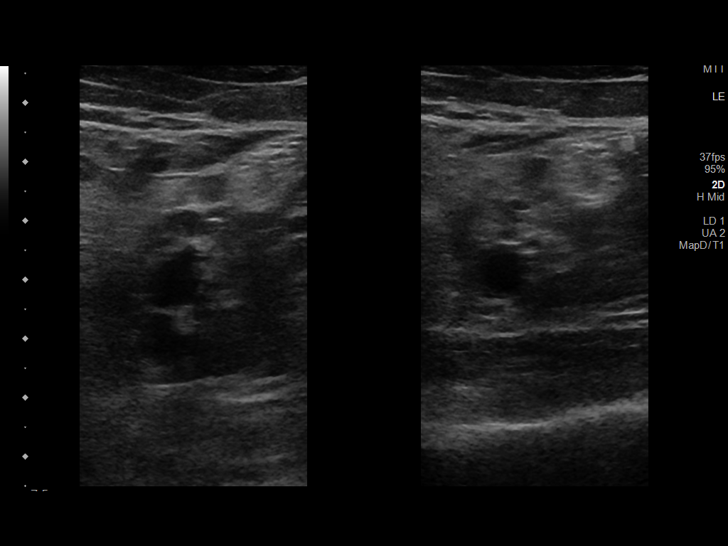
[im 24/43]
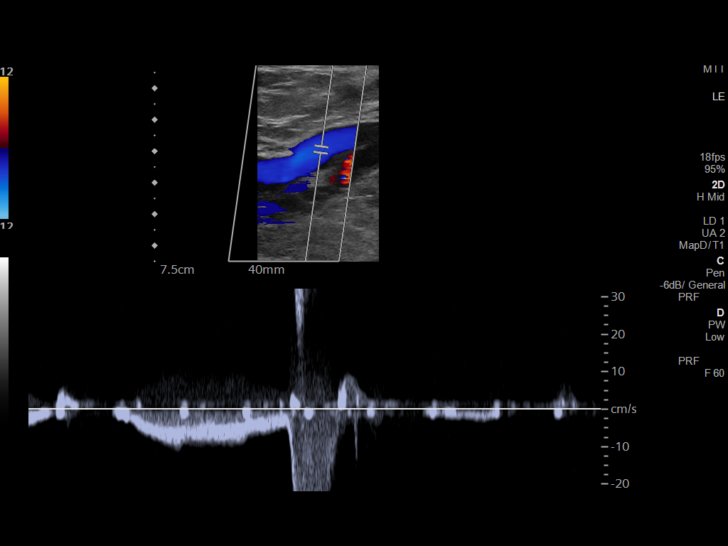
[im 28/43]
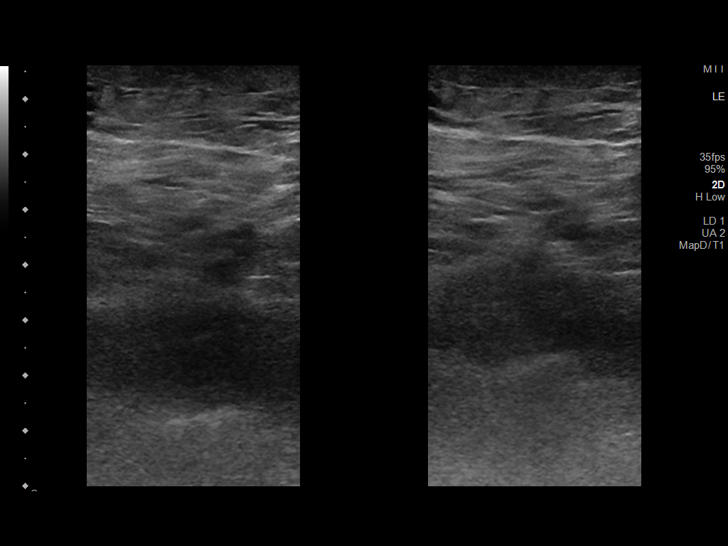
[im 32/43]
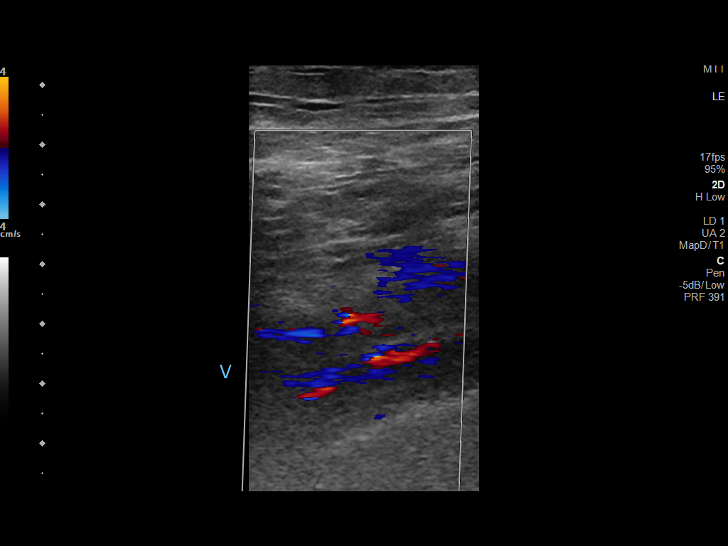
[im 35/43]
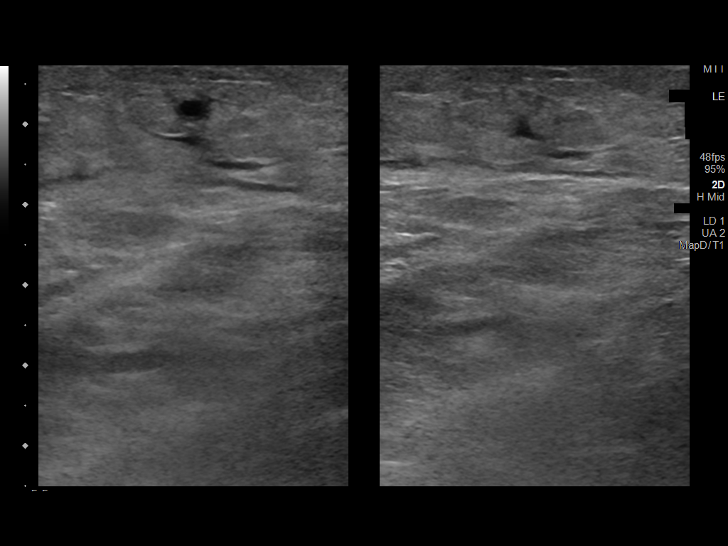
[im 39/43]
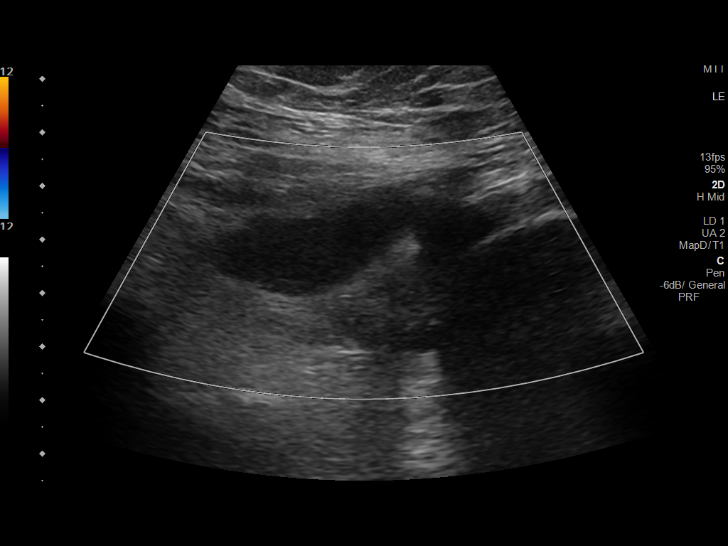
[im 43/43]
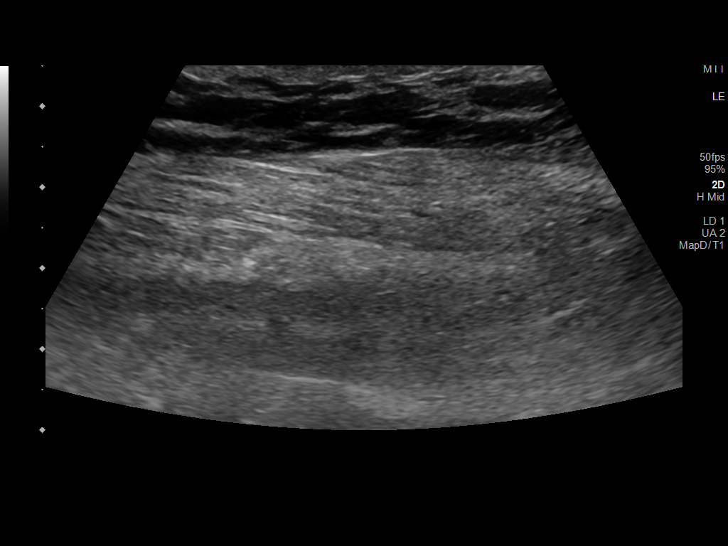

[13 of 24 positions shown; findings below may reference images not displayed]

FINDINGS: Normal compressibility of the common femoral, superficial femoral,
and popliteal veins, as well as the proximal calf veins. No filling
defects to suggest DVT on grayscale or color Doppler imaging.
Doppler waveforms show normal direction of venous flow, normal
respiratory phasicity and response to augmentation.

Subcutaneous edema in the calf and ankle.

Visualized segments of the saphenous venous system normal in caliber
and compressibility.

5.5 x 1.5 x 4.9 cm mildly complex cystic collection in the posterior
popliteal fossa.

Survey views of the contralateral common femoral vein are
unremarkable.
IMPRESSION: 1. No femoropopliteal and no calf DVT in the visualized calf veins.
If clinical symptoms are inconsistent or if there are persistent or
worsening symptoms, further imaging (possibly involving the iliac
veins) may be warranted.
2. Left Baker's cyst

## 2020-08-10 IMAGING — CT CT VENOGRAM ABD-PELV
2 of 10 series · 11 of 46 positions shown, 17 images · IV contrast (Omni 300)
Comparison: Left lower extremity venous Doppler
ultrasound-10/17/2019.

CLINICAL DATA: Left leg pain and edema. Evaluate for proximal
venous compromise.

EXAM:
CT ABDOMEN AND PELVIS WITH CONTRAST
TECHNIQUE: Multidetector CT imaging of the abdomen and pelvis was performed
using the standard protocol following bolus administration of
intravenous contrast.
CONTRAST:  150mL OMNIPAQUE IOHEXOL 350 MG/ML SOLN

[Series 4: (person_name)/ cor · coronal · 0.65mm/px · 2 of 159 slices shown]
[im 53/159  soft-tissue]
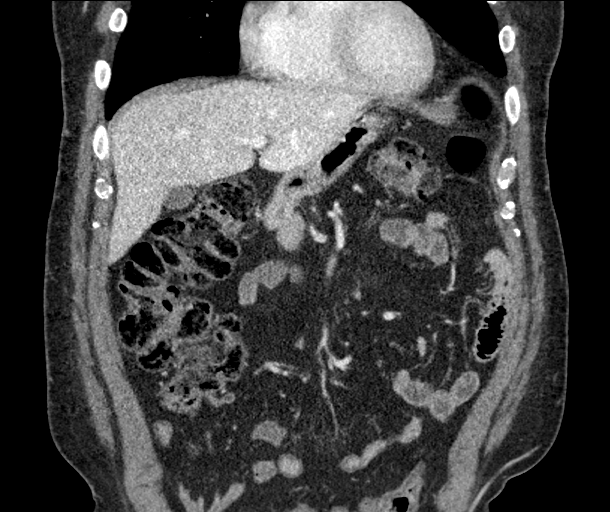
[im 106/159  soft-tissue]
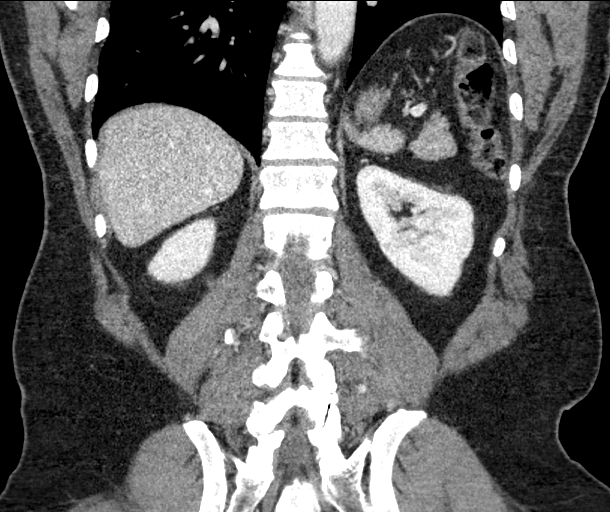

[Series 6: (person_name)/ 5mm · axial · 0.95mm/px · z∈[+316,+1116]mm · 9 of 196 slices shown, 15 images]
[im 18/196  soft-tissue]
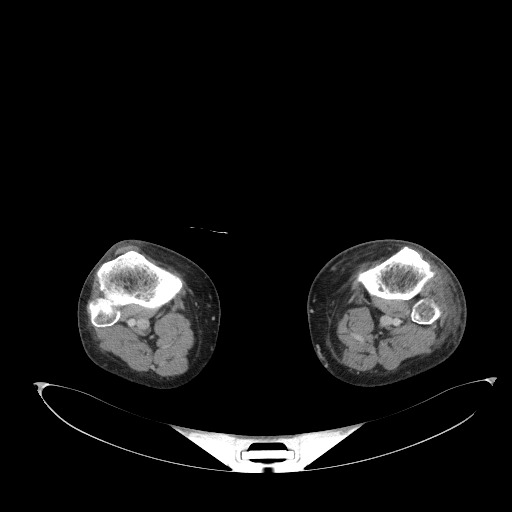
[im 18/196  bone]
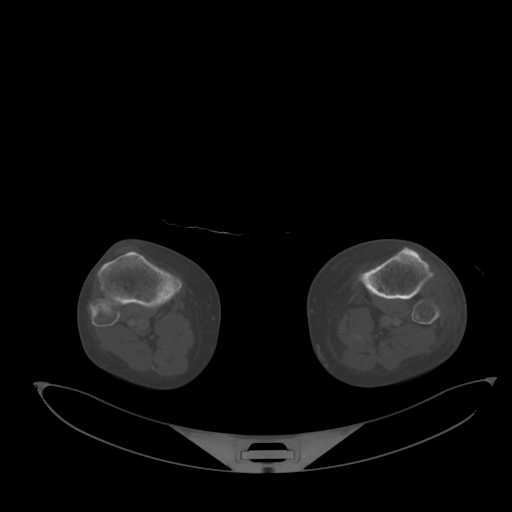
[im 36/196  soft-tissue]
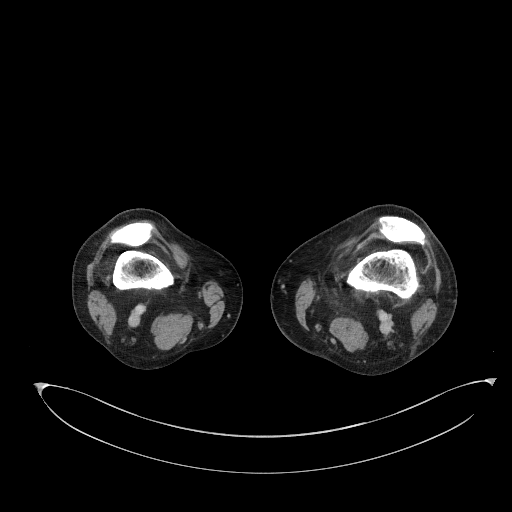
[im 54/196  soft-tissue]
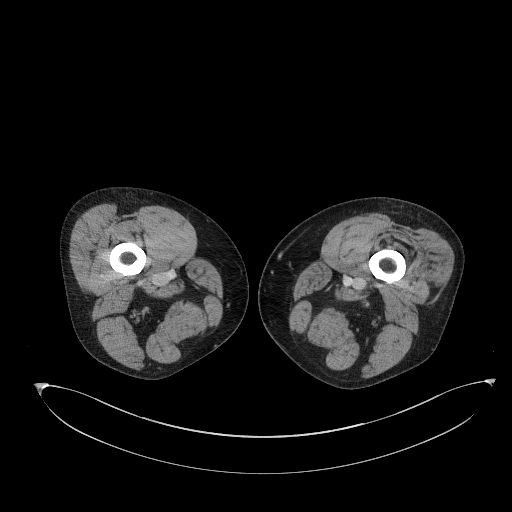
[im 71/196  soft-tissue]
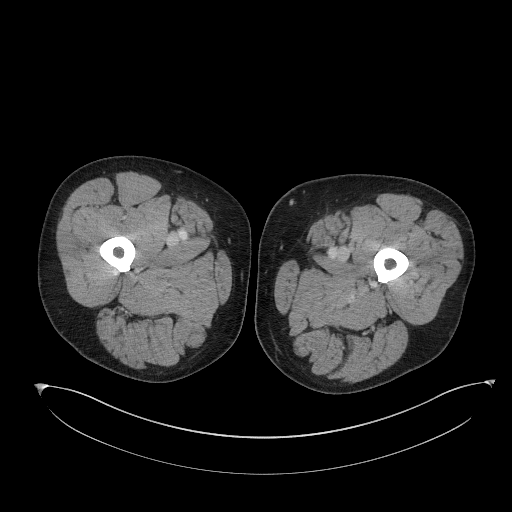
[im 107/196  soft-tissue]
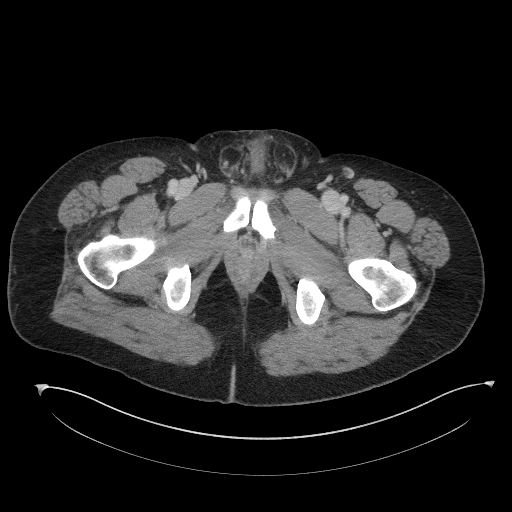
[im 125/196  soft-tissue]
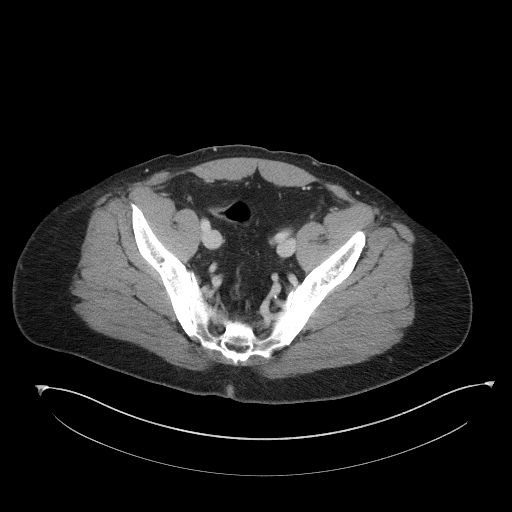
[im 125/196  lung]
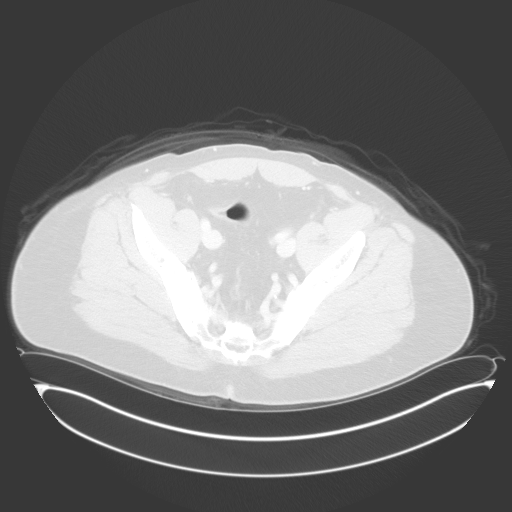
[im 142/196  soft-tissue]
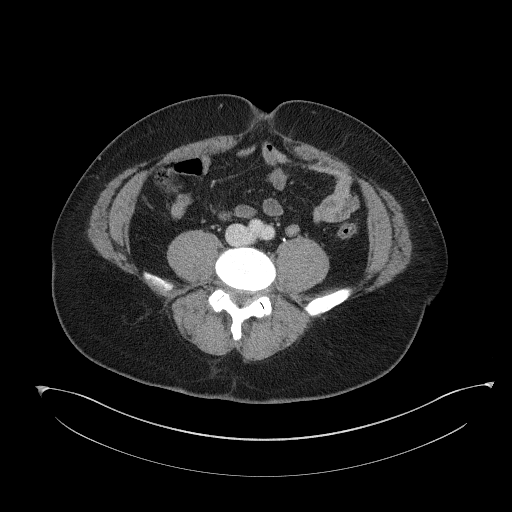
[im 142/196  lung]
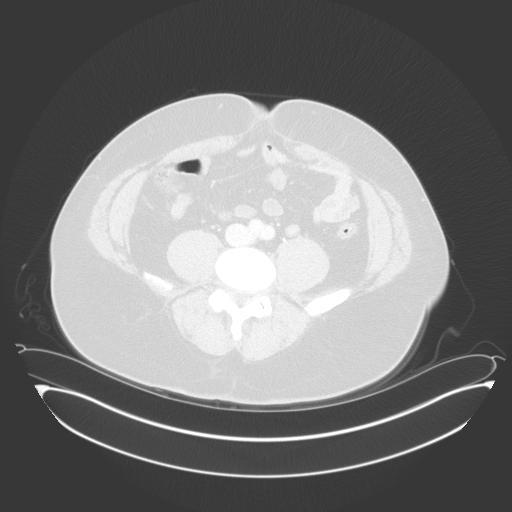
[im 160/196  soft-tissue]
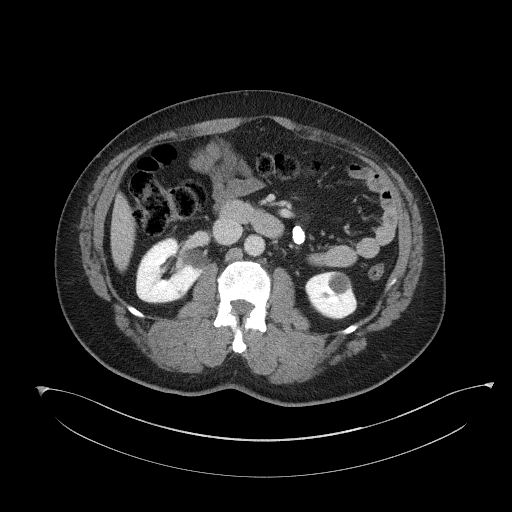
[im 160/196  lung]
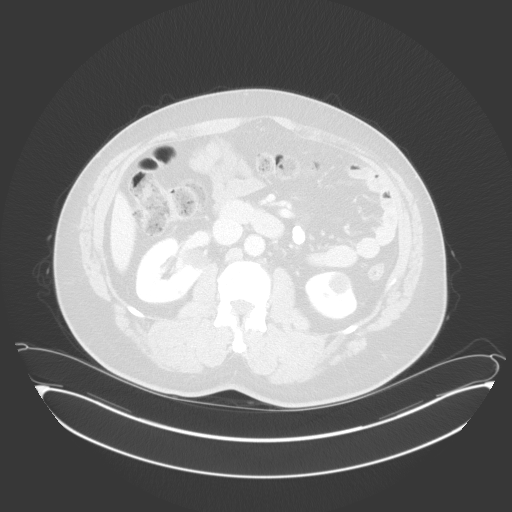
[im 178/196  soft-tissue]
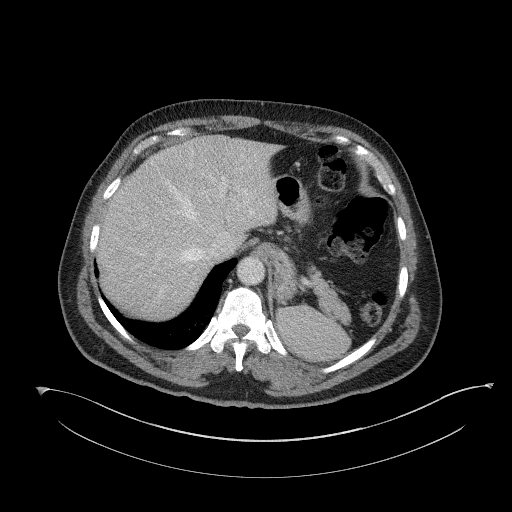
[im 178/196  lung]
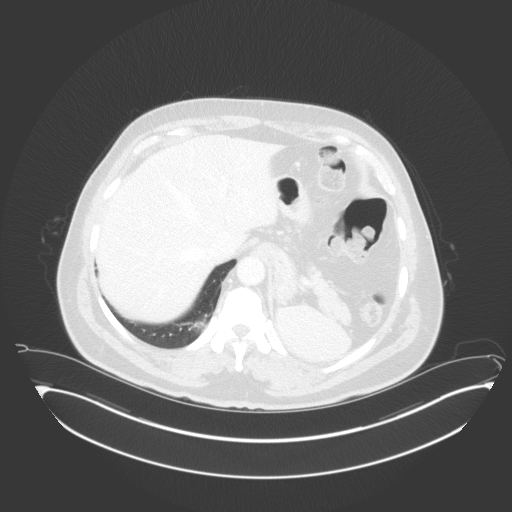
[im 178/196  bone]
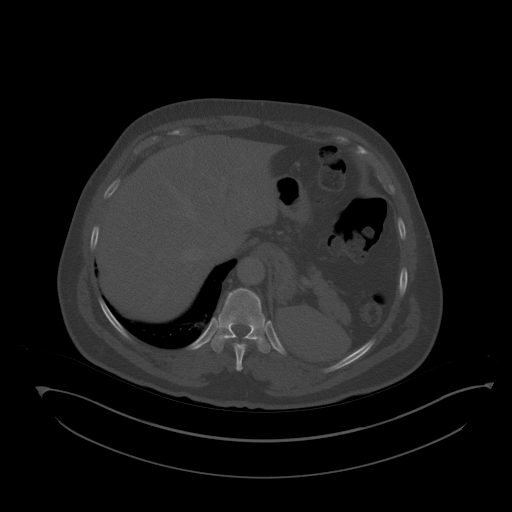

[11 of 46 positions shown; findings below may reference images not displayed]

FINDINGS: Lower chest: Limited visualization lower thorax demonstrates minimal
dependent subpleural ground-glass atelectasis. There is minimal
subsegmental atelectasis within the imaged caudal aspect of lingula.
No discrete focal airspace opacities. No pleural effusion.

Normal heart size.  No pericardial effusion.

Hepatobiliary: Normal hepatic contour. There is a minimal amount of
focal fatty infiltration adjacent to the fissure for ligamentum
teres. No discrete worrisome hepatic lesions. Normal appearance of
the gallbladder given degree distention. No radiopaque gallstones.
No intra or extrahepatic biliary ductal dilatation. No ascites.

Pancreas: Normal appearance of the pancreas.

Spleen: Normal appearance of spleen.

Adrenals/Urinary Tract: There is symmetric enhancement and excretion
of the bilateral kidneys. Note is made of bilateral renal cysts,
both dominant bilateral renal cysts measuring approximately 2.2 cm
in diameter (right-image 36, series 3; left-image 38, series 3).
Additional bilateral subcentimeter hypoattenuating renal lesions are
too small to accurately characterize though favored to represent
additional renal cysts. No definite renal stones this postcontrast
examination. No urine obstruction or perinephric stranding.

Normal appearance of the bilateral adrenal glands.

There is mild thickening the urinary bladder wall, likely
accentuated due to underdistention.

Stomach/Bowel: Moderate to large colonic stool burden without
evidence of enteric obstruction. Normal appearance of the terminal
ileum and appendix. No discrete areas of bowel wall thickening. No
pneumoperitoneum, pneumatosis or portal venous gas.

Vascular/Lymphatic: Scattered minimal amount of atherosclerotic
plaque within a normal caliber abdominal aorta.

The IVC and pelvic venous systems appear widely patent as do the
bilateral common, deep and superficial femoral veins as well as the
bilateral popliteal veins. No discrete venous wall thickening or
perivascular stranding.

Reproductive: Normal appearance the prostate gland. No free fluid
within the pelvic cul-de-sac.

Other: Small bilateral mesenteric fat containing peri umbilical and
inguinal hernias.

Musculoskeletal: Dystrophic calcifications within the left adductor
musculature (image 117, series 6) as well as the right hamstring
musculature (image 116, series 6), likely represents sequela of
remote avulsive injury. No acute or aggressive osseous
abnormalities. Mild-to-moderate multilevel lumbar spine DDD, worse
at L4-L5 with disc space height loss, endplate irregularity and
sclerosis. Stigmata of DISH within the lower thoracic spine.
IMPRESSION: 1. No explanation for patient's bilateral lower extremity edema.
Specifically, normal appearance of the IVC and pelvic venous system.
2. Minimal amount of atherosclerotic plaque within a normal caliber
abdominal aorta. Aortic Atherosclerosis ([id number]).

## 2020-08-14 IMAGING — DX DG CHEST 2V
2 series · 2 of 2 positions shown · non-contrast
Comparison: CT scan of the abdomen and pelvis dated 11/09/2019 and
chest x-ray dated 02/14/2010

CLINICAL DATA: Pedal edema.

EXAM:
CHEST - 2 VIEW

[chest pa]
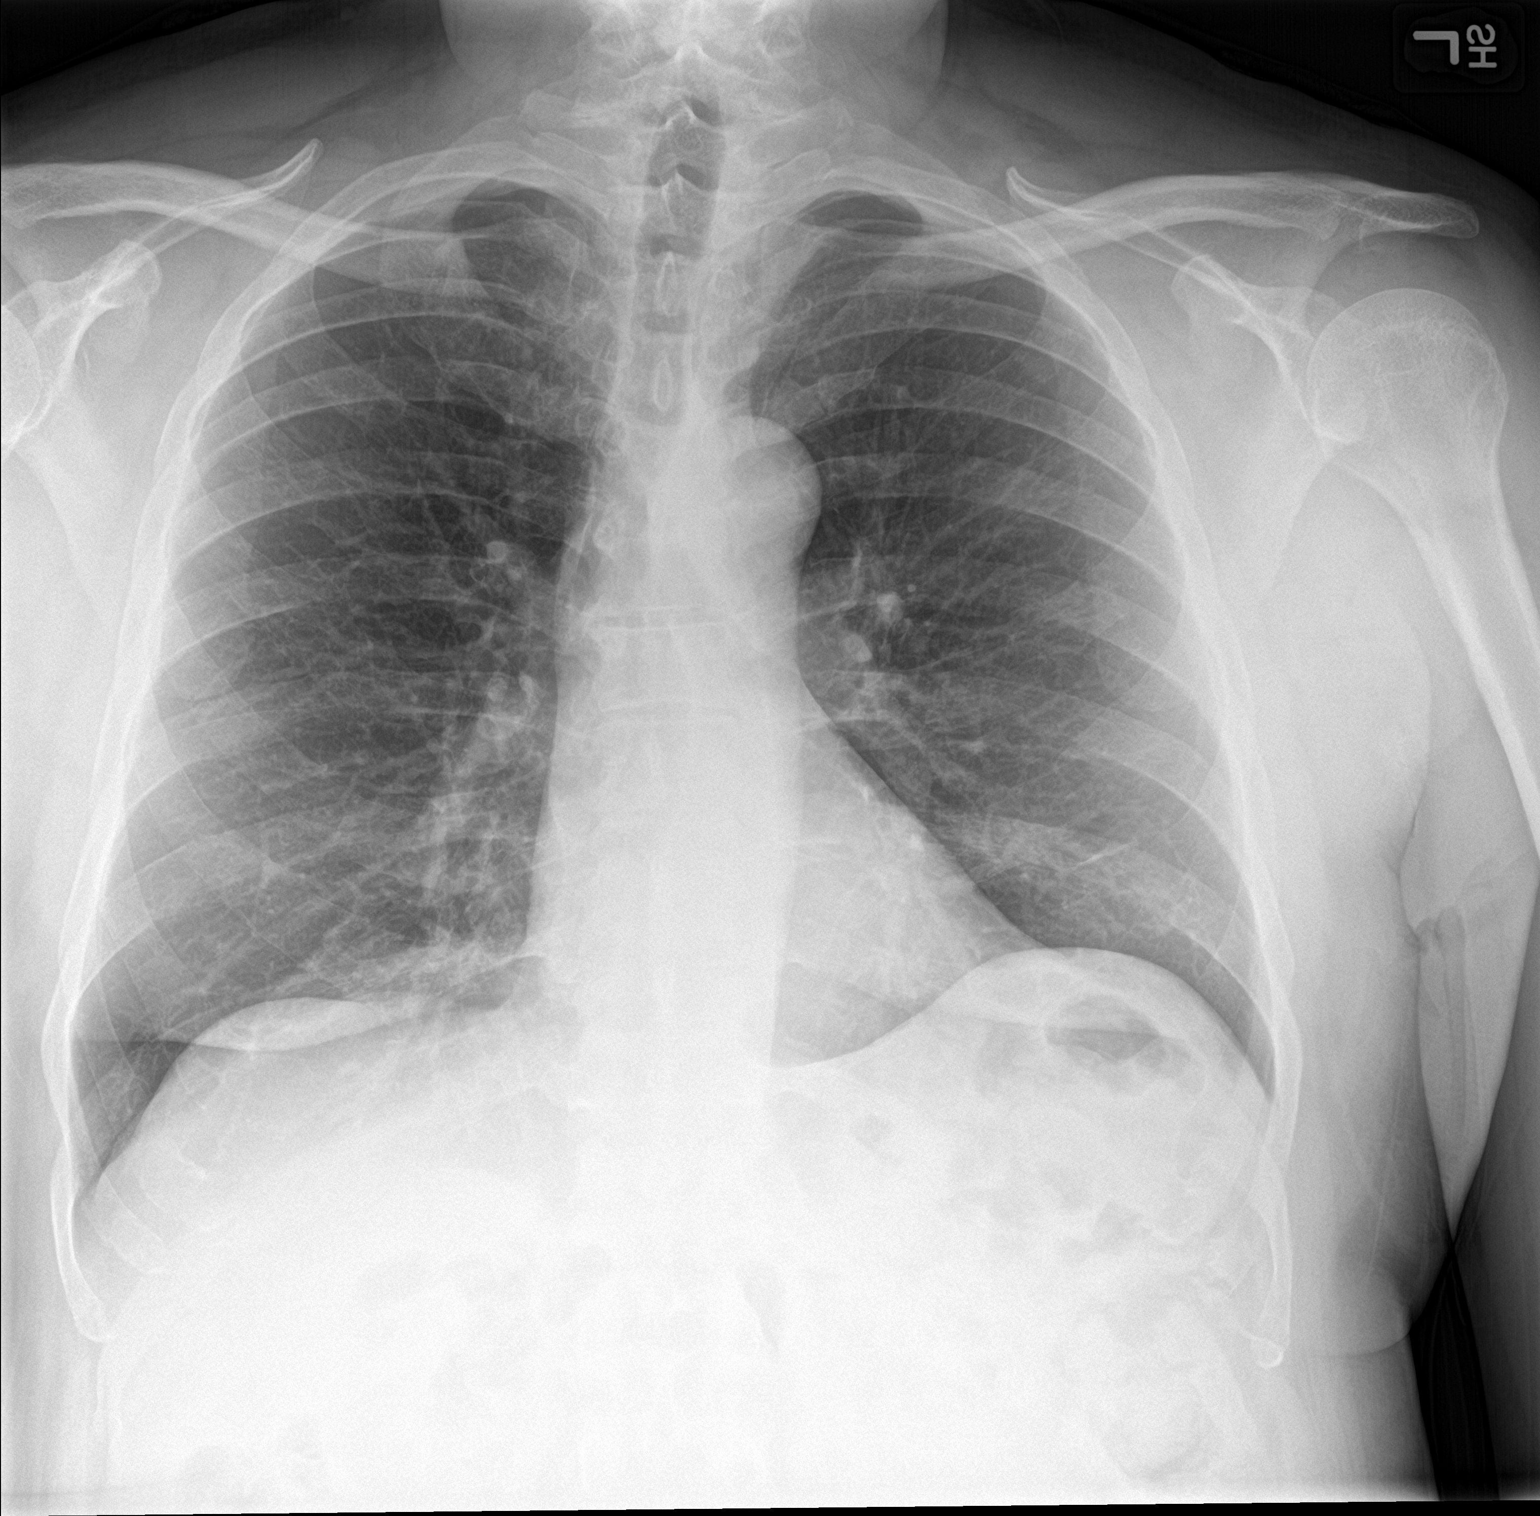

[chest lat]
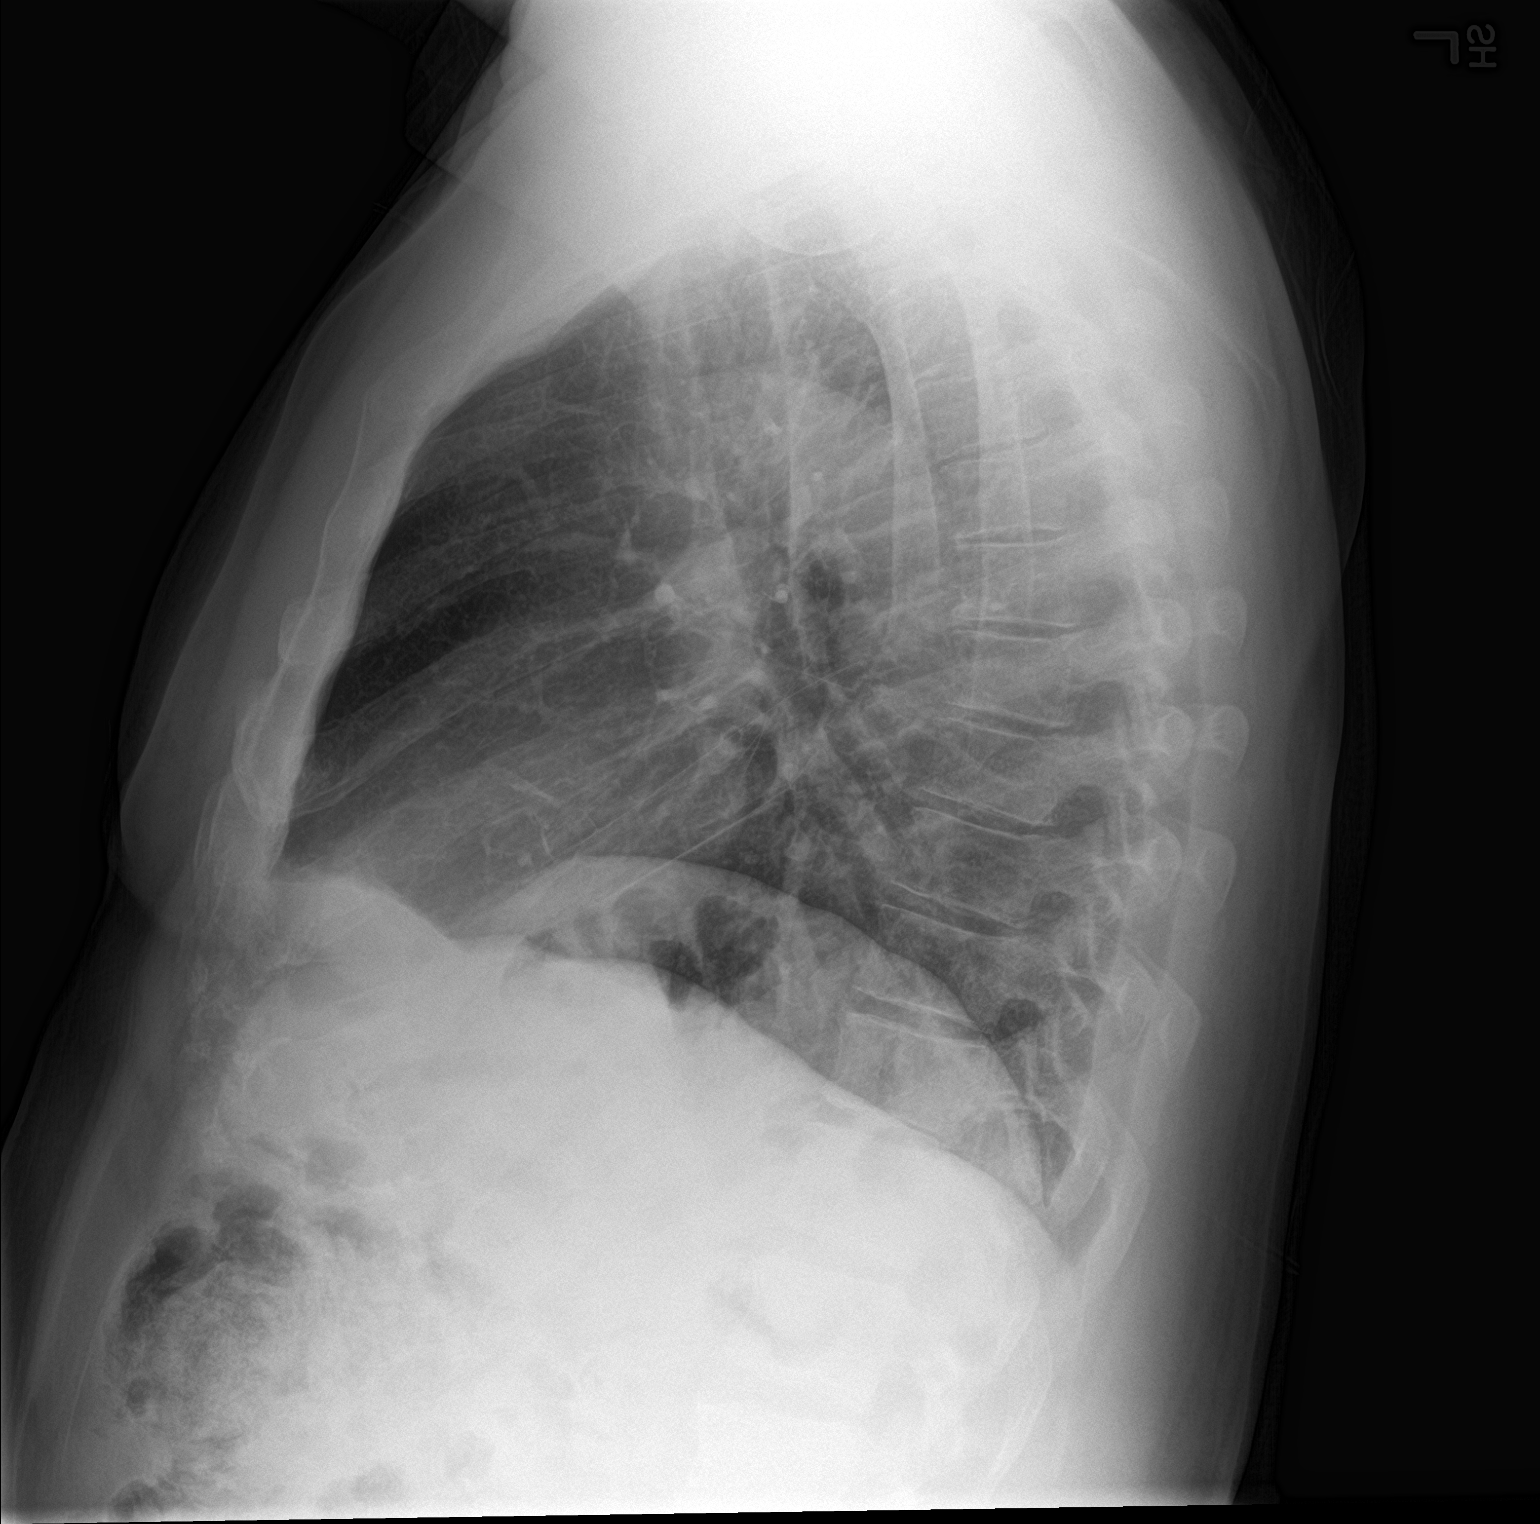

[2 of 2 positions shown; findings below may reference images not displayed]

FINDINGS: The heart size and pulmonary vascularity are normal and the lungs
are clear. No effusions. No bone abnormality. No change since the
prior chest x-ray.
IMPRESSION: Normal exam.  Specifically, no evidence of congestive heart failure.

## 2022-01-19 DIAGNOSIS — M79671 Pain in right foot: Secondary | ICD-10-CM | POA: Diagnosis not present

## 2022-01-19 DIAGNOSIS — M2042 Other hammer toe(s) (acquired), left foot: Secondary | ICD-10-CM | POA: Diagnosis not present

## 2022-01-19 DIAGNOSIS — M79672 Pain in left foot: Secondary | ICD-10-CM | POA: Diagnosis not present

## 2022-01-19 DIAGNOSIS — M2041 Other hammer toe(s) (acquired), right foot: Secondary | ICD-10-CM | POA: Diagnosis not present

## 2022-03-11 DIAGNOSIS — N529 Male erectile dysfunction, unspecified: Secondary | ICD-10-CM | POA: Diagnosis not present

## 2022-03-11 DIAGNOSIS — E785 Hyperlipidemia, unspecified: Secondary | ICD-10-CM | POA: Diagnosis not present

## 2022-03-11 DIAGNOSIS — I10 Essential (primary) hypertension: Secondary | ICD-10-CM | POA: Diagnosis not present

## 2022-06-03 DIAGNOSIS — L84 Corns and callosities: Secondary | ICD-10-CM | POA: Diagnosis not present

## 2022-07-06 ENCOUNTER — Encounter: Payer: Self-pay | Admitting: Medical

## 2022-07-06 ENCOUNTER — Ambulatory Visit: Payer: BC Managed Care – PPO | Admitting: Medical

## 2022-07-06 VITALS — BP 130/70 | HR 70 | Resp 18 | Ht 68.0 in | Wt 225.0 lb

## 2022-07-06 DIAGNOSIS — M79672 Pain in left foot: Secondary | ICD-10-CM

## 2022-07-06 DIAGNOSIS — I10 Essential (primary) hypertension: Secondary | ICD-10-CM | POA: Diagnosis not present

## 2022-07-06 NOTE — Progress Notes (Signed)
   Subjective:    Patient ID: Ronald Sandoval, male    DOB: October 17, 1956, 66 y.o.   MRN: [id number]  HPI Pt states he has felt small painful area just above his left heel for 2 weeks. Hurts when he walks just a little bit. If shoe is padded he has no pain. If barefeet or slipper in house has pain.  With well padded work shoes not pain.  Pt states he knows of a podiatrist.   Htn- bp mild high initially. On hctz 25 mg daily ad losartan 100 mg daily. Improved on recheck.  High cholesterol- pt is on crestor. He states [facility]  Review of Systems  Constitutional:  Negative for chills, fatigue and fever.  Respiratory:  Negative for cough, chest tightness, shortness of breath and wheezing.   Cardiovascular:  Negative for chest pain and palpitations.  Gastrointestinal:  Negative for abdominal pain.  Musculoskeletal:  Negative for back pain.       Left foot pain. See hpi and exam       Objective:   Physical Exam  General- No acute distress. Pleasant patient. Lungs- Clear, even and unlabored. Heart- regular rate and rhythm. Neurologic- CNII- XII grossly intact.  Left foot- small 3 mm corn vs early plantar wart just above heel. Mild tender to palpation. No redness, no warmth or tenderness.     Assessment & Plan:   Patient Instructions  Left foot pain above heel. Small area about 3 mm wide. On close inspection tiny corn vs plantar wart.   Recommend continue to wear well padded shoes and will refer you to podiatrist for evaluation and treatment.  Went ahead and referred to podiatrist [facility] with Dr .Elvin So.  Htn- bp controlled today on recheck. Continue hctz and losartan.  High cholesterol- continue crestor and ask you bring labs from [place] visit this September.  Follow up 6 month or sooner if needed.    Esperanza Richters, PA-C

## 2022-07-06 NOTE — Patient Instructions (Addendum)
Left foot pain above heel. Small area about 3 mm wide. On close inspection tiny corn vs plantar wart.   Recommend continue to wear well padded shoes and will refer you to podiatrist for evaluation and treatment.  Went ahead and referred to podiatrist and [facility] with Dr .Elvin So.  Htn- bp controlled today on recheck. Continue hctz and losartan.  High cholesterol- continue crestor and ask you bring labs from [place] visit this September.  Follow up 6 month or sooner if needed.

## 2022-07-15 DIAGNOSIS — M722 Plantar fascial fibromatosis: Secondary | ICD-10-CM | POA: Diagnosis not present

## 2022-07-15 DIAGNOSIS — M7751 Other enthesopathy of right foot: Secondary | ICD-10-CM | POA: Diagnosis not present

## 2022-07-15 DIAGNOSIS — M7752 Other enthesopathy of left foot: Secondary | ICD-10-CM | POA: Diagnosis not present

## 2022-07-15 DIAGNOSIS — M21611 Bunion of right foot: Secondary | ICD-10-CM | POA: Diagnosis not present

## 2022-07-15 DIAGNOSIS — G5763 Lesion of plantar nerve, bilateral lower limbs: Secondary | ICD-10-CM | POA: Diagnosis not present

## 2022-07-15 DIAGNOSIS — M21612 Bunion of left foot: Secondary | ICD-10-CM | POA: Diagnosis not present

## 2022-07-29 DIAGNOSIS — B079 Viral wart, unspecified: Secondary | ICD-10-CM | POA: Diagnosis not present

## 2022-07-29 DIAGNOSIS — M2012 Hallux valgus (acquired), left foot: Secondary | ICD-10-CM | POA: Diagnosis not present

## 2022-08-12 DIAGNOSIS — B079 Viral wart, unspecified: Secondary | ICD-10-CM | POA: Diagnosis not present

## 2022-08-31 DIAGNOSIS — B079 Viral wart, unspecified: Secondary | ICD-10-CM | POA: Diagnosis not present

## 2022-09-14 ENCOUNTER — Telehealth: Payer: Self-pay | Admitting: Medical

## 2022-09-14 DIAGNOSIS — N529 Male erectile dysfunction, unspecified: Secondary | ICD-10-CM | POA: Diagnosis not present

## 2022-09-14 DIAGNOSIS — L918 Other hypertrophic disorders of the skin: Secondary | ICD-10-CM | POA: Diagnosis not present

## 2022-09-14 DIAGNOSIS — E7800 Pure hypercholesterolemia, unspecified: Secondary | ICD-10-CM | POA: Diagnosis not present

## 2022-09-14 DIAGNOSIS — I10 Essential (primary) hypertension: Secondary | ICD-10-CM | POA: Diagnosis not present

## 2022-09-14 LAB — LAB REPORT - SCANNED
A1c: 6
A1c: 6
A1c: 6.3

## 2022-09-14 NOTE — Telephone Encounter (Signed)
Pt dropped off his immunization record for the flu shot to be scanned in his chart. Paperwork placed in providers box.

## 2022-09-14 NOTE — Telephone Encounter (Signed)
Immunizations updated.

## 2022-09-22 DIAGNOSIS — D485 Neoplasm of uncertain behavior of skin: Secondary | ICD-10-CM | POA: Diagnosis not present

## 2023-05-02 ENCOUNTER — Telehealth: Payer: Self-pay | Admitting: Medical

## 2023-05-02 NOTE — Telephone Encounter (Signed)
Patient would like to transfer care from [facility] to [facility]. Patient stated would like to see a new provider due to having new insurance. Please advise.

## 2023-05-03 NOTE — Telephone Encounter (Signed)
Called Patient and left VM regarding Transfer of care.

## 2023-05-13 ENCOUNTER — Encounter: Payer: Self-pay | Admitting: Family Medicine

## 2023-05-13 DIAGNOSIS — E66811 Obesity, class 1: Secondary | ICD-10-CM | POA: Insufficient documentation

## 2023-05-13 DIAGNOSIS — N529 Male erectile dysfunction, unspecified: Secondary | ICD-10-CM | POA: Insufficient documentation

## 2023-05-13 DIAGNOSIS — I10 Essential (primary) hypertension: Secondary | ICD-10-CM

## 2023-05-13 HISTORY — DX: Essential (primary) hypertension: I10

## 2023-05-16 ENCOUNTER — Encounter: Payer: Self-pay | Admitting: Family Medicine

## 2023-05-16 ENCOUNTER — Ambulatory Visit: Payer: Managed Care, Other (non HMO) | Admitting: Family Medicine

## 2023-05-16 VITALS — BP 151/65 | HR 84 | Ht 68.0 in | Wt 227.0 lb

## 2023-05-16 DIAGNOSIS — I10 Essential (primary) hypertension: Secondary | ICD-10-CM | POA: Diagnosis not present

## 2023-05-16 DIAGNOSIS — Z125 Encounter for screening for malignant neoplasm of prostate: Secondary | ICD-10-CM

## 2023-05-16 DIAGNOSIS — E782 Mixed hyperlipidemia: Secondary | ICD-10-CM

## 2023-05-16 DIAGNOSIS — Z0000 Encounter for general adult medical examination without abnormal findings: Secondary | ICD-10-CM

## 2023-05-16 DIAGNOSIS — Z1329 Encounter for screening for other suspected endocrine disorder: Secondary | ICD-10-CM

## 2023-05-16 DIAGNOSIS — L609 Nail disorder, unspecified: Secondary | ICD-10-CM | POA: Diagnosis not present

## 2023-05-16 NOTE — Assessment & Plan Note (Signed)
Medication management: continue rosuvastatin 20 mg daily  Lifestyle factors for lowering cholesterol include: Diet therapy - heart-healthy diet rich in fruits, veggies, fiber-rich whole grains, lean meats, chicken, fish (at least twice a week), fat-free or 1% dairy products; foods low in saturated/trans fats, cholesterol, sodium, and sugar. Mediterranean diet has shown to be very heart healthy. Regular exercise - recommend at least 30 minutes a day, 5 times per week Weight management  Labs recently done at [facility] per patient, waiting on results to be visible in EMR - declined repeating any labs today

## 2023-05-16 NOTE — Progress Notes (Signed)
New Patient Office Visit  Subjective    Patient ID: Ronald Sandoval, male    DOB: 1956/07/29  Age: 67 y.o. MRN: [id number]  CC:  Chief Complaint  Patient presents with   Transitions Of Care    HPI [facility] presents to establish care. He is transferring from [place], [place]. He is also a patient at the [place] - sees them every 6 months.    Hypertension: - Medications: hctz 25 mg daily, losartan 100 mg  - Compliance: good - Checking BP at home: rarely - Denies any SOB, recurrent headaches, CP, vision changes, LE edema, dizziness, palpitations, or medication side effects. - Diet: general, trying to ear healthier - Exercise: gym and walking 2-3 days per week   Hyperlipidemia: - medications: rosuvastatin 20 mg daily  - compliance: good - medication SEs: none The 10-year ASCVD risk score (Arnett DK, et al., 2019) is: 19.4%*   Values used to calculate the score:     Age: 41 years     Sex: Male     Is Non-Hispanic African American: Yes     Diabetic: No     Tobacco smoker: No     Systolic Blood Pressure: 151 mmHg     Is BP treated: Yes     HDL Cholesterol: 65 mg/dL*     Total Cholesterol: 142 mg/dL*     * - Cholesterol units were assumed for this score calculation  Reports he just had some labs at the [place] and everything was good, so he does not want to do anymore today.   He is requesting a referral to podiatry (Dr. Elvin So) for left great toe nail problem - fungal infection and medial nail pain, possible ingrown nail. Denies any erythema, warmth, inflammation, drainage.        Outpatient Encounter Medications as of 05/16/2023  Medication Sig   hydrochlorothiazide (HYDRODIURIL) 25 MG tablet TAKE ONE TABLET BY MOUTH DAILY FOR HYPERTENSION   loratadine (CLARITIN) 10 MG tablet Take 10 mg by mouth daily.   losartan (COZAAR) 100 MG tablet TAKE ONE TABLET BY MOUTH DAILY FOR HYPERTENSION   rosuvastatin (CRESTOR) 20 MG tablet TAKE ONE-HALF TABLET BY MOUTH ONCE A DAY HIGH  CHOLESTEROL FOR CHOLESTEROL   sildenafil (VIAGRA) 100 MG tablet Take 100 mg by mouth daily as needed for erectile dysfunction.   [DISCONTINUED] loratadine (CLARITIN) 10 MG tablet Take 1 tablet by mouth daily.   No facility-administered encounter medications on file as of 05/16/2023.    Past Medical History:  Diagnosis Date   Allergy    Essential (primary) hypertension 05/13/2023   Hyperlipidemia     Past Surgical History:  Procedure Laterality Date   corn removal Left 2013     History reviewed. No pertinent family history.  Social History   Socioeconomic History   Marital status: Married    Spouse name: Not on file   Number of children: Not on file   Years of education: Not on file   Highest education level: Not on file  Occupational History   Not on file  Tobacco Use   Smoking status: Never   Smokeless tobacco: Never  Substance and Sexual Activity   Alcohol use: No    Alcohol/week: 0.0 standard drinks of alcohol   Drug use: No   Sexual activity: Not on file  Other Topics Concern   Not on file  Social History Narrative   Not on file   Social Determinants of Health   Financial Resource Strain: Not on  file  Food Insecurity: Not on file  Transportation Needs: Not on file  Physical Activity: Not on file  Stress: Not on file  Social Connections: Not on file  Intimate Partner Violence: Not on file    ROS All review of systems negative except what is listed in the HPI      Objective    BP (!) 151/65   Pulse 84   Ht 5\' 8"  (1.727 m)   Wt 227 lb (103 kg)   SpO2 99%   BMI 34.52 kg/m   Physical Exam Vitals reviewed.  Constitutional:      Appearance: Normal appearance.  Cardiovascular:     Rate and Rhythm: Normal rate and regular rhythm.     Pulses: Normal pulses.     Heart sounds: Normal heart sounds.  Pulmonary:     Effort: Pulmonary effort is normal.     Breath sounds: Normal breath sounds.  Feet:     Left foot:     Toenail Condition: Left  toenails are abnormally thick. Fungal disease present. Skin:    General: Skin is warm and dry.  Neurological:     Mental Status: He is alert and oriented to person, place, and time.  Psychiatric:        Mood and Affect: Mood normal.        Behavior: Behavior normal.        Thought Content: Thought content normal.        Judgment: Judgment normal.         Assessment & Plan:   Problem List Items Addressed This Visit     Hyperlipidemia    Medication management: continue rosuvastatin 20 mg daily  Lifestyle factors for lowering cholesterol include: Diet therapy - heart-healthy diet rich in fruits, veggies, fiber-rich whole grains, lean meats, chicken, fish (at least twice a week), fat-free or 1% dairy products; foods low in saturated/trans fats, cholesterol, sodium, and sugar. Mediterranean diet has shown to be very heart healthy. Regular exercise - recommend at least 30 minutes a day, 5 times per week Weight management  Labs recently done at [facility] per patient, waiting on results to be visible in EMR - declined repeating any labs today        Relevant Medications   hydrochlorothiazide (HYDRODIURIL) 25 MG tablet   Hypertension - Primary    Blood pressure is not at goal for age and co-morbidities.   Recommendations: continue current meds, nurse visit BP check in 2 weeks  - BP goal <130/80 - monitor and log blood pressures at home - check around the same time each day in a relaxed setting - Limit salt to <2000 mg/day - Follow DASH eating plan (heart healthy diet) - limit alcohol to 2 standard drinks per day for men and 1 per day for women - avoid tobacco products - get at least 2 hours of regular aerobic exercise weekly Patient aware of signs/symptoms requiring further/urgent evaluation. He reports labs were stable at recent [place] visit - waiting for results to be visible in EMR.       Relevant Medications   hydrochlorothiazide (HYDRODIURIL) 25 MG tablet   Other Visit Diagnoses      Routine health maintenance        Nail problem     Onychomycosis and occasional medial nail pain, possible ingrown nail. No signs of infection today. Requesting referral to podiatry.    Relevant Orders   Ambulatory referral to Podiatry       Return in about  2 weeks (around 05/30/2023) for BP check with nurse.   Clayborne Dana, NP

## 2023-05-16 NOTE — Patient Instructions (Addendum)
Blood pressure is not at goal for age and co-morbidities.   Recommendations: continue current meds, nurse visit BP check in 2 weeks  - BP goal <130/80 - monitor and log blood pressures at home - check around the same time each day in a relaxed setting - Limit salt to <2000 mg/day - Follow DASH eating plan (heart healthy diet) - limit alcohol to 2 standard drinks per day for men and 1 per day for women - avoid tobacco products - get at least 2 hours of regular aerobic exercise weekly Patient aware of signs/symptoms requiring further/urgent evaluation.     Thank you for choosing [facility] at [facility] for your Primary Care needs. I am excited for the opportunity to partner with you to meet your health care goals. It was a pleasure meeting you today!  Information on diet, exercise, and health maintenance recommendations are listed below. This is information to help you be sure you are on track for optimal health and monitoring.   Please look over this and let us know if you have any questions or if you have completed any of the health maintenance outside of [facility] so that we can be sure your records are up to date.  ___________________________________________________________  MyChart:  For all urgent or time sensitive needs we ask that you please call the office to avoid delays. Our number is [phone]. MyChart is not constantly monitored and due to the large volume of messages a day, replies may take up to 72 business hours.  MyChart Policy: MyChart allows for you to see your visit notes, after visit summary, provider recommendations, lab and tests results, make an appointment, request refills, and contact your provider or the office for non-urgent questions or concerns. Providers are seeing patients during normal business hours and do not have built in time to review MyChart messages.  We ask that you allow a minimum of 3 business days for responses to  [employer]. For this reason, please do not send urgent requests through MyChart. Please call the office at [phone]. New and ongoing conditions may require a visit. We have virtual and in-person visits available for your convenience.  Complex MyChart concerns may require a visit. Your provider may request you schedule a virtual or in-person visit to ensure we are providing the best care possible. MyChart messages sent after 11:00 AM on Friday will not be received by the provider until Monday morning.    Lab and Test Results: You will receive your lab and test results on MyChart as soon as they are completed and results have been sent by the lab or testing facility. Due to this service, you will receive your results BEFORE your provider.  I review lab and test results each morning prior to seeing patients. Some results require collaboration with other providers to ensure you are receiving the most appropriate care. For this reason, we ask that you please allow a minimum of 3-5 business days from the time that ALL results have been received for your provider to receive and review lab and test results and contact you about these.  Most lab and test result comments from the provider will be sent through MyChart. Your provider may recommend changes to the plan of care, follow-up visits, repeat testing, ask questions, or request an office visit to discuss these results. You may reply directly to this message or call the office to provide information for the provider or set up an appointment. In some  instances, you will be called with test results and recommendations. Please let us know if this is preferred and we will make note of this in your chart to provide this for you.    If you have not heard a response to your lab or test results in 5 business days from all results returning to MyChart, please call the office to let us know. We ask that you please avoid calling prior to this time unless there is  an emergent concern. Due to high call volumes, this can delay the resulting process.  After Hours: For all non-emergency after hours needs, please call the office at [phone] and select the option to reach the on-call  service. On-call services are shared between multiple [facility] offices and therefore it will not be possible to speak directly with your provider. On-call providers may provide medical advice and recommendations, but are unable to provide refills for maintenance medications.  For all emergency or urgent medical needs after normal business hours, we recommend that you seek care at the closest Urgent Care or Emergency Department to ensure appropriate treatment in a timely manner.  [facility] has a 24 hour emergency room located on the ground floor for your convenience.   Urgent Concerns During the Business Day Providers are seeing patients from 8AM to 5PM with a busy schedule and are most often not able to respond to non-urgent calls until the end of the day or the next business day. If you should have URGENT concerns during the day, please call and speak to the nurse or schedule a same day appointment so that we can address your concern without delay.   Thank you, again, for choosing me as your health care partner. I appreciate your trust and look forward to learning more about you!   Ronald Marrow Reola Calkins, Ronald Sandoval, Ronald Sandoval  ___________________________________________________________  Health Maintenance Recommendations Screening Testing Mammogram Every 1-2 years based on history and risk factors Starting at age 11 Pap Smear Ages 21-39 every 3 years Ages 53-65 every 5 years with HPV testing More frequent testing may be required based on results and history Colon Cancer Screening Every 1-10 years based on test performed, risk factors, and history Starting at age 47 Bone Density Screening Every 2-10 years based on history Starting at age 57 for women Recommendations for  men differ based on medication usage, history, and risk factors AAA Screening One time ultrasound Men 30-33 years old who have ever smoked Lung Cancer Screening Low Dose Lung CT every 12 months Age 12-80 years with a 20 pack-year smoking history who still smoke or who have quit within the last 15 years  Screening Labs Routine  Labs: Complete Blood Count (CBC), Complete Metabolic Panel (CMP), Cholesterol (Lipid Panel) Every 6-12 months based on history and medications May be recommended more frequently based on current conditions or previous results Hemoglobin A1c Lab Every 3-12 months based on history and previous results Starting at age 33 or earlier with diagnosis of diabetes, high cholesterol, BMI >26, and/or risk factors Frequent monitoring for patients with diabetes to ensure blood sugar control Thyroid Panel  Every 6 months based on history, symptoms, and risk factors May be repeated more often if on medication HIV One time testing for all patients 4 and older May be repeated more frequently for patients with increased risk factors or exposure Hepatitis C One time testing for all patients 47 and older May be repeated more frequently for patients with increased risk factors or exposure  Gonorrhea, Chlamydia Every 12 months for all sexually active persons 13-24 years Additional monitoring may be recommended for those who are considered high risk or who have symptoms PSA Men 47-72 years old with risk factors Additional screening may be recommended from age 7-69 based on risk factors, symptoms, and history  Vaccine Recommendations Tetanus Booster All adults every 10 years Flu Vaccine All patients 6 months and older every year COVID Vaccine All patients 12 years and older Initial dosing with booster May recommend additional booster based on age and health history HPV Vaccine 2 doses all patients age 30-26 Dosing may be considered for patients over 26 Shingles Vaccine  (Shingrix) 2 doses all adults 50 years and older Pneumonia (Pneumovax 22) All adults 65 years and older May recommend earlier dosing based on health history Pneumonia (Prevnar 25) All adults 65 years and older Dosed 1 year after Pneumovax 23 Pneumonia (Prevnar 20) All adults 65 years and older (adults 19-64 with certain conditions or risk factors) 1 dose  For those who have not received Prevnar 13 vaccine previously   Additional Screening, Testing, and Vaccinations may be recommended on an individualized basis based on family history, health history, risk factors, and/or exposure.  __________________________________________________________  Diet Recommendations for All Patients  I recommend that all patients maintain a diet low in saturated fats, carbohydrates, and cholesterol. While this can be challenging at first, it is not impossible and small changes can make big differences.  Things to try: Decreasing the amount of soda, sweet tea, and/or juice to one or less per day and replace with water While water is always the first choice, if you do not like water you may consider adding a water additive without sugar to improve the taste other sugar free drinks Replace potatoes with a brightly colored vegetable  Use healthy oils, such as canola oil or olive oil, instead of butter or hard margarine Limit your bread intake to two pieces or less a day Replace regular pasta with low carb pasta options Bake, broil, or grill foods instead of frying Monitor portion sizes  Eat smaller, more frequent meals throughout the day instead of large meals  An important thing to remember is, if you love foods that are not great for your health, you don't have to give them up completely. Instead, allow these foods to be a reward when you have done well. Allowing yourself to still have special treats every once in a while is a nice way to tell yourself thank you for working hard to keep yourself healthy.    Also remember that every day is a new day. If you have a bad day and "fall off the wagon", you can still climb right back up and keep moving along on your journey!  We have resources available to help you!  Some websites that may be helpful include: www.http://www.wall-moore.info/  Www.VeryWellFit.com _____________________________________________________________  Activity Recommendations for All Patients  I recommend that all adults get at least 20 minutes of moderate physical activity that elevates your heart rate at least 5 days out of the week.  Some examples include: Walking or jogging at a pace that allows you to carry on a conversation Cycling (stationary bike or outdoors) Water aerobics Yoga Weight lifting Dancing If physical limitations prevent you from putting stress on your joints, exercise in a pool or seated in a chair are excellent options.  Do determine your MAXIMUM heart rate for activity: 220 - YOUR AGE = MAX Heart Rate   Remember! Do  not push yourself too hard.  Start slowly and build up your pace, speed, weight, time in exercise, etc.  Allow your body to rest between exercise and get good sleep. You will need more water than normal when you are exerting yourself. Do not wait until you are thirsty to drink. Drink with a purpose of getting in at least 8, 8 ounce glasses of water a day plus more depending on how much you exercise and sweat.    If you begin to develop dizziness, chest pain, abdominal pain, jaw pain, shortness of breath, headache, vision changes, lightheadedness, or other concerning symptoms, stop the activity and allow your body to rest. If your symptoms are severe, seek emergency evaluation immediately. If your symptoms are concerning, but not severe, please let us know so that we can recommend further evaluation.

## 2023-05-16 NOTE — Assessment & Plan Note (Signed)
Blood pressure is not at goal for age and co-morbidities.   Recommendations: continue current meds, nurse visit BP check in 2 weeks  - BP goal <130/80 - monitor and log blood pressures at home - check around the same time each day in a relaxed setting - Limit salt to <2000 mg/day - Follow DASH eating plan (heart healthy diet) - limit alcohol to 2 standard drinks per day for men and 1 per day for women - avoid tobacco products - get at least 2 hours of regular aerobic exercise weekly Patient aware of signs/symptoms requiring further/urgent evaluation. He reports labs were stable at recent [place] visit - waiting for results to be visible in EMR.

## 2023-06-06 ENCOUNTER — Ambulatory Visit: Payer: Managed Care, Other (non HMO)

## 2023-06-06 ENCOUNTER — Encounter: Payer: Self-pay | Admitting: Internal Medicine

## 2023-06-10 ENCOUNTER — Encounter: Payer: Self-pay | Admitting: Neurology

## 2023-06-13 ENCOUNTER — Ambulatory Visit: Payer: Managed Care, Other (non HMO) | Admitting: Neurology

## 2023-06-13 VITALS — BP 122/80 | HR 70

## 2023-06-13 DIAGNOSIS — I10 Essential (primary) hypertension: Secondary | ICD-10-CM | POA: Diagnosis not present

## 2023-06-13 NOTE — Progress Notes (Signed)
Patient comes in today for blood pressure check. Patient saw Ladona Ridgel on 05/16/2023 with a blood pressure reading of 151/65:  "Return in about 2 weeks (around 05/30/2023) for BP check with nurse"    Sigurd Sos is taking Losartan 100 mg and hydrochlorothiazide 25 mg one tablet daily for blood pressure control. Trequan denies any side effects, headaches, chest pain, palpitations, dizziness, or shortness of breath.  He states he did miss both doses of medication on Saturday and Sunday this weekend, he was out of town.   Kadin Caisse has been checking blood pressure readings at home occasionally, but can not remember any of the readings.   His blood pressure reading today is: 122/80.  I spoke with Dr. Carmelia Roller who advised patient to continue current medications and return in six months to see Ladona Ridgel, sooner if new symptoms. Patient made aware.

## 2023-09-22 LAB — COMPREHENSIVE METABOLIC PANEL
Albumin: 4 (ref 3.5–5.0)
Calcium: 9.2 (ref 8.7–10.7)
eGFR: 57

## 2023-09-22 LAB — TSH: TSH: 1.39 (ref 0.41–5.90)

## 2023-09-22 LAB — HEPATIC FUNCTION PANEL
ALT: 18 U/L (ref 10–40)
AST: 24 (ref 14–40)
Alkaline Phosphatase: 79 (ref 25–125)
Bilirubin, Direct: 0.1
Bilirubin, Total: 0.3

## 2023-09-22 LAB — LIPID PANEL
Cholesterol: 149 (ref 0–200)
HDL: 63 (ref 35–70)
LDL Cholesterol: 67
Triglycerides: 73 (ref 40–160)

## 2023-09-22 LAB — BASIC METABOLIC PANEL
BUN: 13 (ref 4–21)
CO2: 28 — AB (ref 13–22)
Chloride: 109 — AB (ref 99–108)
Creatinine: 1.4 — AB (ref 0.6–1.3)
Glucose: 95
Potassium: 3.9 meq/L (ref 3.5–5.1)
Sodium: 143 (ref 137–147)

## 2023-09-22 LAB — CBC: RBC: 4.8 (ref 3.87–5.11)

## 2023-09-22 LAB — CBC AND DIFFERENTIAL
HCT: 42 (ref 41–53)
Hemoglobin: 14.3 (ref 13.5–17.5)
Platelets: 279 10*3/uL (ref 150–400)
WBC: 5.3

## 2023-09-22 LAB — PSA: PSA: 0.38

## 2023-09-22 LAB — HEMOGLOBIN A1C: Hemoglobin A1C: 6.2

## 2023-12-09 ENCOUNTER — Encounter: Payer: Self-pay | Admitting: Neurology

## 2023-12-12 ENCOUNTER — Encounter: Payer: Self-pay | Admitting: Family Medicine

## 2023-12-12 ENCOUNTER — Ambulatory Visit: Payer: Managed Care, Other (non HMO) | Admitting: Family Medicine

## 2023-12-12 VITALS — BP 148/83 | HR 83 | Ht 68.0 in | Wt 225.0 lb

## 2023-12-12 DIAGNOSIS — E66811 Obesity, class 1: Secondary | ICD-10-CM

## 2023-12-12 DIAGNOSIS — E785 Hyperlipidemia, unspecified: Secondary | ICD-10-CM

## 2023-12-12 DIAGNOSIS — I10 Essential (primary) hypertension: Secondary | ICD-10-CM

## 2023-12-12 NOTE — Progress Notes (Signed)
Established Patient Office Visit  Subjective    Patient ID: Ronald Sandoval, male    DOB: 1956/04/26  Age: 67 y.o. MRN: [id number]  CC:  Chief Complaint  Patient presents with   Medical Management of Chronic Issues    HPI [facility] presents for routine follow-up, chronic disease management.  Reports he has been doing well. No acute concerns. [facility] last [facility] in [facility] with labs updated.    Hypertension: - Medications: hctz 25 mg daily, losartan 100 mg  - Compliance: good - Checking BP at home: rarely - Denies any SOB, recurrent headaches, CP, vision changes, LE edema, dizziness, palpitations, or medication side effects. - Diet: general, low sodium  - Exercise: minimal  Hyperlipidemia: - medications: rosuvastatin 20 mg daily  - compliance: good - medication SEs: none The 10-year ASCVD risk score (Arnett DK, et al., 2019) is: 19.8%*   Values used to calculate the score:     Age: 78 years     Sex: Male     Is Non-Hispanic African American: Yes     Diabetic: No     Tobacco smoker: No     Systolic Blood Pressure: 148 mmHg     Is BP treated: Yes     HDL Cholesterol: 63 mg/dL*     Total Cholesterol: 149 mg/dL*     * - Cholesterol units were assumed for this score calculation         ROS All review of systems negative except what is listed in the HPI      Objective    BP (!) 148/83   Pulse 83   Ht 5\' 8"  (1.727 m)   Wt 225 lb (102.1 kg)   SpO2 100%   BMI 34.21 kg/m   Physical Exam Vitals reviewed.  Constitutional:      Appearance: Normal appearance.  Cardiovascular:     Rate and Rhythm: Normal rate and regular rhythm.     Pulses: Normal pulses.     Heart sounds: Normal heart sounds.  Pulmonary:     Effort: Pulmonary effort is normal.     Breath sounds: Normal breath sounds.  Skin:    General: Skin is warm and dry.  Neurological:     Mental Status: He is alert and oriented to person, place, and time.   Psychiatric:        Mood and Affect: Mood normal.        Behavior: Behavior normal.        Thought Content: Thought content normal.        Judgment: Judgment normal.     Lab Results  Component Value Date   WBC 5.3 09/22/2023   HGB 14.3 09/22/2023   HCT 42 09/22/2023   MCV 86.6 07/09/2019   PLT 279 09/22/2023   Last metabolic panel Lab Results  Component Value Date   GLUCOSE 105 (H) 11/13/2019   NA 143 09/22/2023   K 3.9 09/22/2023   CL 109 (A) 09/22/2023   CO2 28 (A) 09/22/2023   BUN 13 09/22/2023   CREATININE 1.4 (A) 09/22/2023   EGFR 57 09/22/2023   CALCIUM 9.2 09/22/2023   PROT 6.7 11/13/2019   ALBUMIN 4.0 09/22/2023   BILITOT 0.8 11/13/2019   ALKPHOS 79 09/22/2023   AST 24 09/22/2023   ALT 18 09/22/2023   Lab Results  Component Value Date   HGBA1C 6.2 09/22/2023   Lab Results  Component Value Date   TSH 1.39 09/22/2023  Lab Results  Component Value Date   CHOL 149 09/22/2023   HDL 63 09/22/2023   LDLCALC 67 09/22/2023   TRIG 73 09/22/2023   CHOLHDL 3 07/09/2019       Assessment & Plan:    Problem List Items Addressed This Visit       Active Problems   Hyperlipidemia - Primary   Medication management: continue rosuvastatin 20 mg daily  Lifestyle factors for lowering cholesterol include: Diet therapy - heart-healthy diet rich in fruits, veggies, fiber-rich whole grains, lean meats, chicken, fish (at least twice a week), fat-free or 1% dairy products; foods low in saturated/trans fats, cholesterol, sodium, and sugar. Mediterranean diet has shown to be very heart healthy. Regular exercise - recommend at least 30 minutes a day, 5 times per week Weight management  Labs recently done at [facility], stable.        Obesity, Class I, BMI 30-34.9   Continue current medications Encourage healthy diet and regular exercise      Hypertension   Blood pressure is not at goal for age and co-morbidities.   Recommendations: continue hctz 25 mg daily, losartan  100 mg daily; nurse visit in 2 weeks to recheck - BP goal <130/80 - monitor and log blood pressures at home - check around the same time each day in a relaxed setting - Limit salt to <2000 mg/day - Follow DASH eating plan (heart healthy diet) - limit alcohol to 2 standard drinks per day for men and 1 per day for women - avoid tobacco products - get at least 2 hours of regular aerobic exercise weekly Patient aware of signs/symptoms requiring further/urgent evaluation.         Labs done in September at [place] (above). No repeats today.   Return in about 2 weeks (around 12/26/2023) for BP check with nurse; routine follow-up in 6 months.   Clayborne Dana, NP

## 2023-12-12 NOTE — Assessment & Plan Note (Signed)
Medication management: continue rosuvastatin 20 mg daily  Lifestyle factors for lowering cholesterol include: Diet therapy - heart-healthy diet rich in fruits, veggies, fiber-rich whole grains, lean meats, chicken, fish (at least twice a week), fat-free or 1% dairy products; foods low in saturated/trans fats, cholesterol, sodium, and sugar. Mediterranean diet has shown to be very heart healthy. Regular exercise - recommend at least 30 minutes a day, 5 times per week Weight management  Labs recently done at [facility], stable.

## 2023-12-12 NOTE — Assessment & Plan Note (Signed)
Blood pressure is not at goal for age and co-morbidities.   Recommendations: continue hctz 25 mg daily, losartan 100 mg daily; nurse visit in 2 weeks to recheck - BP goal <130/80 - monitor and log blood pressures at home - check around the same time each day in a relaxed setting - Limit salt to <2000 mg/day - Follow DASH eating plan (heart healthy diet) - limit alcohol to 2 standard drinks per day for men and 1 per day for women - avoid tobacco products - get at least 2 hours of regular aerobic exercise weekly Patient aware of signs/symptoms requiring further/urgent evaluation.

## 2023-12-12 NOTE — Patient Instructions (Addendum)
Blood pressure is not at goal for age and co-morbidities.   Recommendations: continue hctz 25 mg daily, losartan 100 mg daily; nurse visit in 2 weeks to recheck - BP goal <130/80 - monitor and log blood pressures at home - check around the same time each day in a relaxed setting - Limit salt to <2000 mg/day - Follow DASH eating plan (heart healthy diet) - limit alcohol to 2 standard drinks per day for men and 1 per day for women - avoid tobacco products - get at least 2 hours of regular aerobic exercise weekly Patient aware of signs/symptoms requiring further/urgent evaluation.

## 2023-12-12 NOTE — Assessment & Plan Note (Signed)
Continue current medications Encourage healthy diet and regular exercise

## 2023-12-26 ENCOUNTER — Ambulatory Visit (INDEPENDENT_AMBULATORY_CARE_PROVIDER_SITE_OTHER): Payer: Managed Care, Other (non HMO)

## 2023-12-26 DIAGNOSIS — I10 Essential (primary) hypertension: Secondary | ICD-10-CM | POA: Diagnosis not present

## 2023-12-26 NOTE — Progress Notes (Signed)
Pt here for Blood pressure check per PCP  Pt currently takes: hydrochlorothiazide 25 mg and Losartan 100 mg  Pt reports compliance with medication.  BP today @ = L arm 146/84 R arm 152/96 HR = 86  Pt advised per Dr. Patsy Lager to add amlodipine 2.5 mg or 5 mg to current medications, pt stated he would not like to make a change at this time until his PCP is able to review readings. Advised pt we would be back in touch with him with any adjustments that need to be made.   BP Readings from Last 3 Encounters:  12/12/23 (!) 148/83  06/13/23 122/80  05/16/23 (!) 151/65

## 2025-01-04 ENCOUNTER — Encounter: Payer: Self-pay | Admitting: Family Medicine

## 2025-01-04 ENCOUNTER — Ambulatory Visit (INDEPENDENT_AMBULATORY_CARE_PROVIDER_SITE_OTHER): Admitting: Family Medicine

## 2025-01-04 VITALS — BP 131/73 | HR 85 | Ht 68.0 in | Wt 226.0 lb

## 2025-01-04 DIAGNOSIS — E782 Mixed hyperlipidemia: Secondary | ICD-10-CM

## 2025-01-04 DIAGNOSIS — I10 Essential (primary) hypertension: Secondary | ICD-10-CM | POA: Diagnosis not present

## 2025-01-04 NOTE — Assessment & Plan Note (Signed)
 Medication management: continue rosuvastatin 20 mg daily  Lifestyle factors for lowering cholesterol include: Diet therapy - heart-healthy diet rich in fruits, veggies, fiber-rich whole grains, lean meats, chicken, fish (at least twice a week), fat-free or 1% dairy products; foods low in saturated/trans fats, cholesterol, sodium, and sugar. Mediterranean diet has shown to be very heart healthy. Regular exercise - recommend at least 30 minutes a day, 5 times per week Weight management  Labs recently done at [facility], stable. Will try to request.

## 2025-01-04 NOTE — Assessment & Plan Note (Signed)
 Blood pressure is at goal for age and co-morbidities.   Recommendations: continue hctz 25 mg daily, telmisartan 80 mg daily - BP goal <130/80 - monitor and log blood pressures at home - check around the same time each day in a relaxed setting - Limit salt to <2000 mg/day - Follow DASH eating plan (heart healthy diet) - limit alcohol to 2 standard drinks per day for men and 1 per day for women - avoid tobacco products - get at least 2 hours of regular aerobic exercise weekly Patient aware of signs/symptoms requiring further/urgent evaluation.

## 2025-01-04 NOTE — Progress Notes (Signed)
 "  Established Patient Office Visit  Subjective:  Patient ID: Ronald Sandoval, male    DOB: May 20, 1956  Age: 69 y.o. MRN: [id number]  CC:  Chief Complaint  Patient presents with   Medical Management of Chronic Issues   Hypertension      HPI Ronald Sandoval is here for routine follow up. [facility]. No acute concerns.    Hypertension: - Medications: HCTZ 25 mg daily, Telmisartan 80 mg daily.  - Compliance: good - Checking BP at home: yes, well controlled <120/80 consistently  - Denies any SOB, recurrent headaches, CP, vision changes, LE edema, dizziness, palpitations, or medication side effects. - Diet: general, low sodium  - Exercise: minimal BP Readings from Last 3 Encounters:  01/04/25 131/73  12/12/23 (!) 148/83  06/13/23 122/80     Hyperlipidemia: - medications: rosuvastatin 20 mg daily  - compliance: good - medication SEs: none The 10-year ASCVD risk score (Arnett DK, et al., 2019) is: 16.4%   Values used to calculate the score:     Age: 13 years     Clinically relevant sex: Male     Is Non-Hispanic African American: Yes     Diabetic: No     Tobacco smoker: No     Systolic Blood Pressure: 131 mmHg     Is BP treated: Yes     HDL Cholesterol: 61.8 mg/dL     Total Cholesterol: 141 mg/dL          Past Medical History:  Diagnosis Date   Allergy    Essential (primary) hypertension 05/13/2023   Hyperlipidemia     Past Surgical History:  Procedure Laterality Date   corn removal Left 2013     Family History  Problem Relation Age of Onset   Alcohol abuse Father    Cancer Sister    Depression Brother     Social History   Socioeconomic History   Marital status: Married    Spouse name: Not on file   Number of children: Not on file   Years of education: Not on file   Highest education level: 12th grade  Occupational History   Not on file  Tobacco Use   Smoking status: Never    Smokeless tobacco: Never  Substance and Sexual Activity   Alcohol use: No    Alcohol/week: 0.0 standard drinks of alcohol   Drug use: No   Sexual activity: Not on file  Other Topics Concern   Not on file  Social History Narrative   Not on file   Social Drivers of Health   Tobacco Use: Low Risk (01/04/2025)   Patient History    Smoking Tobacco Use: Never    Smokeless Tobacco Use: Never    Passive Exposure: Not on file  Financial Resource Strain: Low Risk (01/03/2025)   Overall Financial Resource Strain (CARDIA)    Difficulty of Paying Living Expenses: Not hard at all  Food Insecurity: No Food Insecurity (01/03/2025)   Epic    Worried About Radiation Protection Practitioner of Food in the Last Year: Never true    Ran Out of Food in the Last Year: Never true  Transportation Needs: No Transportation Needs (01/03/2025)   Epic    Lack of Transportation (Medical): No    Lack of Transportation (Non-Medical): No  Physical Activity: Insufficiently Active (01/03/2025)   Exercise Vital Sign    Days of Exercise per Week: 3 days    Minutes of Exercise  per Session: 40 min  Stress: No Stress Concern Present (01/03/2025)   [employer] of Occupational Health - Occupational Stress Questionnaire    Feeling of Stress: Not at all  Social Connections: Moderately Integrated (01/03/2025)   Social Connection and Isolation Panel    Frequency of Communication with Friends and Family: Once a week    Frequency of Social Gatherings with Friends and Family: Once a week    Attends Religious Services: 1 to 4 times per year    Active Member of [employer] or Organizations: Yes    Attends Banker Meetings: 1 to 4 times per year    Marital Status: Married  Catering Manager Violence: Unknown (02/18/2023)   Received from [facility]   HITS    Physically Hurt: Not on file    Insult or Talk Down To: Not on file    Threaten Physical Harm: Not on file    Scream or Curse: Not on file  Depression (PHQ2-9): Low Risk (01/04/2025)    Depression (PHQ2-9)    PHQ-2 Score: 0  Alcohol Screen: Low Risk (01/03/2025)   Alcohol Screen    Last Alcohol Screening Score (AUDIT): 1  Housing: Low Risk (01/03/2025)   Epic    Unable to Pay for Housing in the Last Year: No    Number of Times Moved in the Last Year: 0    Homeless in the Last Year: No  Utilities: Not on file  Health Literacy: Not on file    ROS All ROS negative except what is listed in the HPI.   Objective:   Today's Vitals: BP 131/73   Pulse 85   Ht 5' 8 (1.727 m)   Wt 226 lb (102.5 kg)   SpO2 100%   BMI 34.36 kg/m   Physical Exam Vitals reviewed.  Constitutional:      General: He is not in acute distress.    Appearance: Normal appearance. He is obese. He is not ill-appearing.  Cardiovascular:     Rate and Rhythm: Normal rate and regular rhythm.     Pulses: Normal pulses.     Heart sounds: Normal heart sounds.  Pulmonary:     Effort: Pulmonary effort is normal.     Breath sounds: Normal breath sounds.  Skin:    General: Skin is warm and dry.  Neurological:     Mental Status: He is alert and oriented to person, place, and time.  Psychiatric:        Mood and Affect: Mood normal.        Behavior: Behavior normal.        Thought Content: Thought content normal.        Judgment: Judgment normal.        Assessment & Plan:   Problem List Items Addressed This Visit       Active Problems   Hyperlipidemia   Medication management: continue rosuvastatin 20 mg daily  Lifestyle factors for lowering cholesterol include: Diet therapy - heart-healthy diet rich in fruits, veggies, fiber-rich whole grains, lean meats, chicken, fish (at least twice a week), fat-free or 1% dairy products; foods low in saturated/trans fats, cholesterol, sodium, and sugar. Mediterranean diet has shown to be very heart healthy. Regular exercise - recommend at least 30 minutes a day, 5 times per week Weight management  Labs recently done at [facility], stable. Will try to request.         Relevant Medications   hydrochlorothiazide  (HYDRODIURIL ) 25 MG tablet   telmisartan (MICARDIS) 80  MG tablet   Hypertension - Primary   Blood pressure is at goal for age and co-morbidities.   Recommendations: continue hctz 25 mg daily, telmisartan 80 mg daily - BP goal <130/80 - monitor and log blood pressures at home - check around the same time each day in a relaxed setting - Limit salt to <2000 mg/day - Follow DASH eating plan (heart healthy diet) - limit alcohol to 2 standard drinks per day for men and 1 per day for women - avoid tobacco products - get at least 2 hours of regular aerobic exercise weekly Patient aware of signs/symptoms requiring further/urgent evaluation.       Relevant Medications   hydrochlorothiazide  (HYDRODIURIL ) 25 MG tablet   telmisartan (MICARDIS) 80 MG tablet        Follow-up: Return in about 6 months (around 07/04/2025) for chronic disease management, late July.   Waddell FURY Almarie, DNP, FNP-C  I,Emily Lagle,acting as a neurosurgeon for Waddell KATHEE Almarie, NP.,have documented all relevant documentation on the behalf of Waddell KATHEE Almarie, NP.  I, Waddell KATHEE Almarie, NP, have reviewed all documentation for this visit. The documentation on 01/04/2025 for the exam, diagnosis, procedures, and orders are all accurate and complete. "

## 2025-01-18 ENCOUNTER — Ambulatory Visit (INDEPENDENT_AMBULATORY_CARE_PROVIDER_SITE_OTHER): Payer: Self-pay | Admitting: Podiatry

## 2025-01-18 ENCOUNTER — Encounter: Payer: Self-pay | Admitting: Podiatry

## 2025-01-18 DIAGNOSIS — M7752 Other enthesopathy of left foot: Secondary | ICD-10-CM | POA: Diagnosis not present

## 2025-01-18 DIAGNOSIS — M7751 Other enthesopathy of right foot: Secondary | ICD-10-CM | POA: Diagnosis not present

## 2025-01-18 NOTE — Progress Notes (Signed)
 Patient presents with complaint of pain and fifth toes bilaterally.  Weeks to see him at the old office.  These and started bothering him again.  Gets corns buildup.  Has not been using anything on them.  Painful with walking and wearing shoes.  Has not noticed any redness or drainage or bleeding.   Physical exam:  General appearance: Pleasant, and in no acute distress. AOx3.  Vascular: Pedal pulses: DP 2/4 bilaterally, PT 2/4 bilaterally.  Mild edema lower legs bilaterally. Capillary fill time immediate bilaterally.  Neurological: Light touch intact feet bilaterally.  Normal Achilles reflex bilaterally.    Dermatologic:   Tender hyperkeratotic lesions fifth toes bilaterally at the dorsal PIPJ.  Skin normal temperature bilaterally.  Skin normal color, tone, and texture bilaterally.   Musculoskeletal: Tenderness at the PIPJ of the fifth toes bilaterally.  Hammertoes fifth toes bilaterally.    Diagnosis: 1.  Capsulitis fifth toe bilaterally secondary to hyperkeratotic lesion.  Plan: -Established office visit for evaluation and management level 3. - Discussed with him the hammertoes resulting in the hyperkeratotic lesions and resulting capsulitis on the fifth toe at the PIPJ's.  Debrided hyperkeratotic lesions today.  Dispensed silicone sleeves for the fifth toes.  Discussed using a pumice stone or similar abrasive to keep them from building up.  Discussed proper shoes.   Return PRN

## 2025-07-05 ENCOUNTER — Ambulatory Visit: Admitting: Family Medicine
